# Patient Record
Sex: Male | Born: 1950 | Race: White | Hispanic: No | Marital: Married | Smoking: Former smoker
Health system: Southern US, Community
[De-identification: ages and names within clinical notes are randomized; demographics above are authoritative.]

## PROBLEM LIST (undated history)

## (undated) DIAGNOSIS — M109 Gout, unspecified: Secondary | ICD-10-CM

## (undated) DIAGNOSIS — E119 Type 2 diabetes mellitus without complications: Secondary | ICD-10-CM

## (undated) DIAGNOSIS — M5137 Other intervertebral disc degeneration, lumbosacral region: Secondary | ICD-10-CM

## (undated) DIAGNOSIS — M19011 Primary osteoarthritis, right shoulder: Secondary | ICD-10-CM

## (undated) DIAGNOSIS — E785 Hyperlipidemia, unspecified: Secondary | ICD-10-CM

## (undated) HISTORY — DX: Primary osteoarthritis, right shoulder: M19.011

## (undated) HISTORY — DX: Gout, unspecified: M10.9

## (undated) HISTORY — DX: Type 2 diabetes mellitus without complications: E11.9

## (undated) HISTORY — DX: Hyperlipidemia, unspecified: E78.5

## (undated) HISTORY — DX: Other intervertebral disc degeneration, lumbosacral region: M51.37

## (undated) HISTORY — PX: ROTATOR CUFF REPAIR: SHX139

## (undated) HISTORY — PX: LUMBAR LAMINECTOMY: SHX95

---

## 2000-07-13 ENCOUNTER — Ambulatory Visit (HOSPITAL_COMMUNITY): Admission: RE | Admit: 2000-07-13 | Discharge: 2000-07-13 | Payer: Self-pay | Admitting: Urology

## 2008-01-01 ENCOUNTER — Ambulatory Visit (HOSPITAL_COMMUNITY): Admission: RE | Admit: 2008-01-01 | Discharge: 2008-01-02 | Payer: Self-pay | Admitting: Specialist

## 2009-05-06 ENCOUNTER — Ambulatory Visit (HOSPITAL_COMMUNITY): Admission: RE | Admit: 2009-05-06 | Discharge: 2009-05-06 | Payer: Self-pay | Admitting: Specialist

## 2010-05-24 ENCOUNTER — Other Ambulatory Visit: Payer: Self-pay | Admitting: Internal Medicine

## 2010-05-24 ENCOUNTER — Encounter: Payer: Self-pay | Admitting: Internal Medicine

## 2010-05-24 ENCOUNTER — Ambulatory Visit
Admission: RE | Admit: 2010-05-24 | Discharge: 2010-05-24 | Payer: Self-pay | Source: Home / Self Care | Attending: Internal Medicine | Admitting: Internal Medicine

## 2010-05-24 DIAGNOSIS — E119 Type 2 diabetes mellitus without complications: Secondary | ICD-10-CM | POA: Insufficient documentation

## 2010-05-24 DIAGNOSIS — M19019 Primary osteoarthritis, unspecified shoulder: Secondary | ICD-10-CM | POA: Insufficient documentation

## 2010-05-24 DIAGNOSIS — M109 Gout, unspecified: Secondary | ICD-10-CM | POA: Insufficient documentation

## 2010-05-24 DIAGNOSIS — I10 Essential (primary) hypertension: Secondary | ICD-10-CM | POA: Insufficient documentation

## 2010-05-24 DIAGNOSIS — M545 Low back pain, unspecified: Secondary | ICD-10-CM | POA: Insufficient documentation

## 2010-05-24 DIAGNOSIS — E785 Hyperlipidemia, unspecified: Secondary | ICD-10-CM | POA: Insufficient documentation

## 2010-05-24 LAB — LIPID PANEL
Cholesterol: 100 mg/dL (ref 0–200)
HDL: 25.3 mg/dL — ABNORMAL LOW (ref >39.00)
LDL Cholesterol: 58 mg/dL (ref 0–99)
Total CHOL/HDL Ratio: 4
VLDL: 17.2 mg/dL (ref 0.0–40.0)

## 2010-05-24 LAB — BASIC METABOLIC PANEL
BUN: 22 mg/dL (ref 6–23)
Calcium: 9.4 mg/dL (ref 8.4–10.5)
Creatinine, Ser: 1.1 mg/dL (ref 0.4–1.5)
GFR: 72.17 mL/min (ref >60.00)
Glucose, Bld: 101 mg/dL — ABNORMAL HIGH (ref 70–99)
Potassium: 4.7 mEq/L (ref 3.5–5.1)
Sodium: 141 mEq/L (ref 135–145)

## 2010-05-24 LAB — HEPATIC FUNCTION PANEL
ALT: 29 U/L (ref 0–53)
Total Protein: 6.9 g/dL (ref 6.0–8.3)

## 2010-06-02 NOTE — Assessment & Plan Note (Signed)
Summary: NEW [employer] PT--PKG--#---STC   Vital Signs:  Patient profile:   60 year old male Height:      72 inches Weight:      224 pounds BMI:     30.49 O2 Sat:      97 % on Room air Temp:     98.0 degrees F oral Pulse rate:   70 / minute BP sitting:   128 / 78  (left arm) Cuff size:   large  Vitals Entered By: Ami Bullins CMA (May 24, 2010 1:40 PM)  O2 Flow:  Room air CC: cpx/ ab  Vision Screening:      Vision Comments: 07/2009 normal exam   Primary Care Provider:  [name] Jeffery Valdez  CC:  cpx/ ab.  History of Present Illness: Patient presents to re-establish for on-going continuity care. He has no problems at this time.   In the recent past he had a cardiac work-up with a nuclear stress test  that was read out as normal [facility] Valdez cardiology - Mady Haagensen). He had been on metroprolol but due to heart rate in the 40's and low BP medication was stopped and he has been doing well on Benicar.   His diabetes has been well controlled with metformin. He has had recent treatment for URI with z-pak. This has driven his CBGs into the low 100s from the usual 90s. His last A1C was in July '11 and was close to 6%. Generally he is feeling well.  He had back surgery in '09 - lumbar foraminotomy. January '11 - repair of right rotator cuff (Dr.  Jillyn Hidden)  He is otherwise doing well. Had a flex many years ago with polyps. He has never had recommended follow-up.  Preventive Screening-Counseling & Management  Alcohol-Tobacco     Alcohol drinks/day: 0     Smoking Status: quit     Year Quit: 1974  Caffeine-Diet-Exercise     Caffeine use/day: none     Does Patient Exercise: yes     Type of exercise: walking     Times/week: 3  Hep-HIV-STD-Contraception     Dental Visit-last 6 months yes     Sun Exposure-Excessive: no  Safety-Violence-Falls     Seat Belt Use: yes     Firearms in the Home: firearms in the home     Smoke Detectors: yes     Violence in the Home: no risk noted     Sexual  Abuse: no     Fall Risk: low fall risk      Blood Transfusions:  no.    Current Medications (verified): 1)  Ginseng 250 Mg Caps (Ginseng) .Marland Kitchen.. 1 Daily 2)  Fish Oil 1000 Mg Caps (Omega-3 Fatty Acids) .Marland Kitchen.. 1 Capsule Daily 3)  Metformin Hcl 500 Mg Tabs (Metformin Hcl) .Marland Kitchen.. 1 Tablet Two Times A Day 4)  Benicar Hct 40-25 Mg Tabs (Olmesartan Medoxomil-Hctz) .Marland Kitchen.. 1 Tablet Once Daily 5)  Simvastatin 40 Mg Tabs (Simvastatin) .... 1/2 Tablet At Bedtime 6)  Allopurinol 300 Mg Tabs (Allopurinol) .Marland Kitchen.. 1 Tablet Once Daily 7)  Azithromycin 250 Mg Tabs (Azithromycin)  Allergies (verified): No Known Drug Allergies  Past History:  Past Medical History: HYPERLIPIDEMIA (ICD-272.4) DEGENERATIVE JOINT DISEASE, RIGHT SHOULDER (ICD-715.91) DISC DISEASE, LUMBOSACRAL SPINE (ICD-722.52) GOUT, UNSPECIFIED (ICD-274.9) ESSENTIAL HYPERTENSION (ICD-401.9) DIABETES MELLITUS, TYPE II, CONTROLLED (ICD-250.00)   Physician Roster:               ortho - Dr. 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         last eye exam April '11 - no retinopathy (Dr. Gavin Potters - Mady Haagensen)  Physical Exam  General:  Overweight white male in no distress Head:  Normocephalic and atraumatic without obvious abnormalities. No apparent alopecia or balding. Eyes:  No corneal or conjunctival inflammation noted. EOMI. Perrla. Funduscopic exam benign, without hemorrhages, exudates or papilledema. Vision grossly normal. Ears:  External ear exam shows no significant lesions or deformities.  Otoscopic examination reveals clear canals, tympanic membranes are intact bilaterally without bulging, retraction, inflammation or discharge. Hearing is grossly normal bilaterally. Nose:  no external deformity and no external erythema.   Mouth:  Oral mucosa and oropharynx without lesions or exudates.  Teeth in good repair. Neck:  supple, full ROM, no thyromegaly, and no carotid bruits.   Chest Wall:  No deformities, masses, tenderness or gynecomastia noted. Lungs:  Normal respiratory effort, chest expands symmetrically. Lungs are clear to auscultation, no crackles or wheezes. Heart:  Normal rate and regular rhythm. S1 and S2 normal without gallop, murmur, click, rub or other extra sounds. Abdomen:  obese, soft, non-tender, normal bowel sounds, no guarding, no rigidity, no rebound tenderness, and no hepatomegaly.   Prostate:  deferred after full discussion of options. Msk:  normal ROM, no joint tenderness, no joint swelling, no joint warmth, and no joint instability.   Pulses:  2+ radial and DP pulses Extremities:  No clubbing, cyanosis, edema, or deformity noted with normal full range of motion of all joints.   Neurologic:  alert & oriented X3, cranial nerves II-XII intact, strength normal in all extremities, gait normal, and DTRs symmetrical and normal.   Skin:  turgor normal, no suspicious lesions, and no ulcerations.   Cervical Nodes:  no anterior cervical adenopathy and no posterior cervical  adenopathy.   Psych:  Oriented X3, memory intact for recent and remote, normally interactive, good eye contact, and not anxious appearing.    Diabetes Management Exam:    Foot Exam (with socks and/or shoes not present):       Sensory-Pinprick/Light touch:          Right medial foot (L-4): normal          Right dorsal foot (L-5): normal          Right lateral foot (S-1): normal       Sensory-Monofilament:          Right foot: normal       Sensory-other: normal deep vibratory sensation.       Inspection:          Right foot: normal  Nails:          Right foot: normal    Eye Exam:       Eye Exam done elsewhere          Date: 02/09/2010          Results: normal          Done by: Mady Haagensen Opthalmologist   Impression & Recommendations:  Problem # 1:  HYPERLIPIDEMIA (ICD-272.4) Due for lab with recommendations to follow.  His updated medication list for this problem includes:    Simvastatin 40 Mg Tabs (Simvastatin) .Marland Kitchen... 1/2 tablet at bedtime  Orders: TLB-Lipid Panel (80061-LIPID) TLB-Hepatic/Liver Function Pnl (80076-HEPATIC)  Addendum - LDL excellent at 58. HDL is low at 25.3  Plan - continue simvastatine at 40mg            consider use of fishoil to raise HDL  Problem # 2:  DEGENERATIVE JOINT DISEASE, RIGHT SHOULDER (ICD-715.91) Well preserved function with no limitations in his desired activities.  Plan - range of motiion exercise          APAP or NSAIDs as needed.          surgicalconsult if pain worsen or there is any limitation i ADLs  Problem # 3:  GOUT, UNSPECIFIED (ICD-274.9) No recent flares. He does take allopurinol routinely.  Plan - uric acid level with recommendations to follow.  His updated medication list for this problem includes:    Allopurinol 300 Mg Tabs (Allopurinol) .Marland Kitchen... 1 tablet once daily  Orders: TLB-Uric Acid, Blood (84550-URIC)  Addendum - Uric level is in normal range. Plan is to continue allopurinol.  Problem # 4:  ESSENTIAL  HYPERTENSION (ICD-401.9)  His updated medication list for this problem includes:    Benicar Hct 40-25 Mg Tabs (Olmesartan medoxomil-hctz) .Marland Kitchen... 1 tablet once daily  Orders: TLB-BMP (Basic Metabolic Panel-BMET) (80048-METABOL)  BP today: 128/78  Addendeum - great control and renal function and electrolytes are normal.  Problem # 5:  DIABETES MELLITUS, TYPE II, CONTROLLED (ICD-250.00) Dur for follow-up lab with recommendations to follow.  His updated medication list for this problem includes:    Metformin Hcl 500 Mg Tabs (Metformin hcl) .Marland Kitchen... 1 tablet two times a day    Benicar Hct 40-25 Mg Tabs (Olmesartan medoxomil-hctz) .Marland Kitchen... 1 tablet once daily  Orders: TLB-A1C / Hgb A1C (Glycohemoglobin) (83036-A1C)  Addendum - A1C 7% = at goal.  Plan - continue present dose of metformin.  Problem # 6:  Preventive Health Care (ICD-V70.0) Interval history is normal. Physical exam except for weight is normal. Lab results are excellent. Pt is due for screening colonoscopy and follow-up after flex six several years ago with polyp -referred to GI. Prostate cancer screening with PSA is normal. Immunizations - up to date with tetnus. Recommend consideeration of pneumonia vaccine and shingles vaccine.  In - summary : a very nice many who remains active with his calling. He is medically stable. He will continue on his present regimen and return as needed .   Complete Medication List: 1)  Ginseng 250 Mg Caps (Ginseng) .Marland Kitchen.. 1 daily 2)  Fish Oil 1000 Mg Caps (Omega-3 fatty acids) .Marland Kitchen.. 1 capsule daily 3)  Metformin Hcl 500 Mg Tabs (Metformin hcl) .Marland Kitchen.. 1 tablet two times a day 4)  Benicar Hct 40-25 Mg Tabs (Olmesartan medoxomil-hctz) .Marland Kitchen.. 1 tablet once daily 5)  Simvastatin 40 Mg Tabs (Simvastatin) .... 1/2 tablet at bedtime 6)  Allopurinol 300 Mg Tabs (Allopurinol) .Marland Kitchen.. 1 tablet once daily 7)  Azithromycin 250 Mg Tabs (Azithromycin)  Other Orders: TLB-PSA (Prostate Specific Antigen)  (84153-PSA)   Patient: Jeffery Valdez Note: All result statuses are Final unless otherwise noted.  Tests: (1) Hemoglobin A1C (A1C)   Hemoglobin A1C       [H]  7.0 %                       4.6-6.5     Glycemic Control Guidelines for People with Diabetes:     Non Diabetic:  <6%     Goal of Therapy: <7%     Additional Action Suggested:  >8%   Tests: (2) BMP (METABOL)   Sodium                    141 mEq/L                   135-145   Potassium                 4.7 mEq/L                   3.5-5.1   Chloride                  105 mEq/L                   96-112   Carbon Dioxide            30 mEq/L                    19-32   Glucose              [H]  101 mg/dL                   16-10   BUN                       22 mg/dL                    9-60   Creatinine                1.1 mg/dL                   4.5-4.0   Calcium                   9.4 mg/dL                   9.8-11.9   GFR                       72.17 mL/min                >60.00  Tests: (3) Uric Acid (URIC)   Uric Acid                 4.6 mg/dL                   1.4-7.8  Tests: (4) Lipid Panel (LIPID)   Cholesterol               100 mg/dL                   2-956     ATP III Classification            Desirable:  < 200 mg/dL  Borderline High:  200 - 239 mg/dL               High:  > = 240 mg/dL   Triglycerides             86.0 mg/dL                  7.8-295.6     Normal:  <150 mg/dL     Borderline High:  213 - 199 mg/dL   HDL                  [L]  08.65 mg/dL                 >78.46   VLDL Cholesterol          17.2 mg/dL                  9.6-29.5   LDL Cholesterol           58 mg/dL                    2-84  CHO/HDL Ratio:  CHD Risk                             4                    Men          Women     1/2 Average Risk     3.4          3.3     Average Risk          5.0          4.4     2X Average Risk          9.6          7.1     3X Average Risk          15.0          11.0                           Tests: (5)  Hepatic/Liver Function Panel (HEPATIC)   Total Bilirubin           0.5 mg/dL                   1.3-2.4   Direct Bilirubin          0.1 mg/dL                   4.0-1.0   Alkaline Phosphatase [L]  38 U/L                      39-117   AST                       22 U/L                      0-37   ALT                       29 U/L                      0-53   Total Protein  6.9 g/dL                    9.1-4.7   Albumin                   4.1 g/dL                    8.2-9.5  Tests: (6) Prostate Specific Antigen (PSA)   PSA-Hyb                   1.01 ng/mL                  0.10-4.00  Orders Added: 1)  Est. Patient 40-64 years [99396] 2)  New Patient Level II [99202] 3)  TLB-A1C / Hgb A1C (Glycohemoglobin) [83036-A1C] 4)  TLB-BMP (Basic Metabolic Panel-BMET) [80048-METABOL] 5)  TLB-Uric Acid, Blood [84550-URIC] 6)  TLB-Lipid Panel [80061-LIPID] 7)  TLB-Hepatic/Liver Function Pnl [80076-HEPATIC] 8)  TLB-PSA (Prostate Specific Antigen) [62130-QMV]   Immunization History:  Influenza Immunization History:    Influenza:  historical (03/01/2010)   Immunization History:  Influenza Immunization History:    Influenza:  Historical (03/01/2010)

## 2010-06-03 ENCOUNTER — Telehealth: Payer: Self-pay | Admitting: Internal Medicine

## 2010-06-08 NOTE — Progress Notes (Signed)
  Phone Note Refill Request Message from:  Fax from Pharmacy on June 03, 2010 8:27 AM  Refills Requested: Medication #1:  BENICAR HCT 40-25 MG TABS 1 tablet once daily Initial call taken by: Ami Bullins CMA,  June 03, 2010 8:27 AM    Prescriptions: BENICAR HCT 40-25 MG TABS (OLMESARTAN MEDOXOMIL-HCTZ) 1 tablet once daily  #90 x 3   Entered by:   Ami Bullins CMA   Authorized by:   Jacques Navy MD   Signed by:   Bill Salinas CMA on 06/03/2010   Method used:   Faxed to ...       [facility] (mail-order)       [address]       [place], [place]  [address]       Ph: [phone]       Fax: [phone]   RxID:   [id number]

## 2010-06-09 ENCOUNTER — Telehealth: Payer: Self-pay | Admitting: Internal Medicine

## 2010-06-16 NOTE — Progress Notes (Signed)
  Phone Note Call from Patient Call back at [phone]   Caller: Spouse Summary of Call: Patient is requesting a 10 day supply of Benicar to be sent to [facility] untill his mail order arrives. Initial call taken by: Rock Nephew CMA,  June 09, 2010 8:49 AM    Prescriptions: BENICAR HCT 40-25 MG TABS (OLMESARTAN MEDOXOMIL-HCTZ) 1 tablet once daily  #11 x 0   Entered by:   Ami Bullins CMA   Authorized by:   Jacques Navy MD   Signed by:   Bill Salinas CMA on 06/09/2010   Method used:   Electronically to        [facility]. [phone]* (retail)       [address]       [place], [place]  [address]       Ph: [phone] or [phone]       Fax: [phone]   RxID:   [id number]

## 2010-06-17 ENCOUNTER — Telehealth: Payer: Self-pay | Admitting: Internal Medicine

## 2010-06-22 NOTE — Progress Notes (Signed)
  Phone Note Call from Patient Call back at Home Phone [phone]   Caller: [phone] Summary of Call: Req a call today regarding benicar.  Initial call taken by: Lamar Sprinkles, CMA,  June 17, 2010 4:40 PM  Follow-up for Phone Call        Rx's resent - wife aware Follow-up by: Lamar Sprinkles, CMA,  June 17, 2010 5:39 PM    Prescriptions: BENICAR HCT 40-25 MG TABS (OLMESARTAN MEDOXOMIL-HCTZ) 1 tablet once daily  #14 x 0   Entered by:   Lamar Sprinkles, CMA   Authorized by:   Jacques Navy MD   Signed by:   Lamar Sprinkles, CMA on 06/17/2010   Method used:   Electronically to        [facility]. [phone]* (retail)       [address]       [place], [place]  [address]       Ph: [phone] or [phone]       Fax: [phone]   RxID:   [id number] BENICAR HCT 40-25 MG TABS (OLMESARTAN MEDOXOMIL-HCTZ) 1 tablet once daily  #90 x 3   Entered by:   Lamar Sprinkles, CMA   Authorized by:   Jacques Navy MD   Signed by:   Lamar Sprinkles, CMA on 06/17/2010   Method used:   Faxed to ...       [facility] (mail-order)       [address] [place], [place]  [address]       Ph: [phone]       Fax: [phone]   RxID:   [id number]

## 2010-07-16 LAB — COMPREHENSIVE METABOLIC PANEL
Alkaline Phosphatase: 34 U/L — ABNORMAL LOW (ref 39–117)
BUN: 20 mg/dL (ref 6–23)
Calcium: 9.4 mg/dL (ref 8.4–10.5)
Creatinine, Ser: 0.98 mg/dL (ref 0.4–1.5)
GFR calc Af Amer: >60 mL/min (ref >60)
GFR calc non Af Amer: >60 mL/min (ref >60)
Glucose, Bld: 131 mg/dL — ABNORMAL HIGH (ref 70–99)
Potassium: 4.5 mEq/L (ref 3.5–5.1)
Sodium: 139 mEq/L (ref 135–145)

## 2010-07-16 LAB — CBC
HCT: 45.3 % (ref 39.0–52.0)
Hemoglobin: 15.3 g/dL (ref 13.0–17.0)
MCHC: 33.8 g/dL (ref 30.0–36.0)
MCV: 94.9 fL (ref 78.0–100.0)
RBC: 4.77 MIL/uL (ref 4.22–5.81)

## 2010-07-16 LAB — GLUCOSE, CAPILLARY

## 2010-07-21 ENCOUNTER — Other Ambulatory Visit: Payer: Self-pay | Admitting: Dermatology

## 2010-08-01 ENCOUNTER — Encounter: Payer: Self-pay | Admitting: Internal Medicine

## 2010-08-01 ENCOUNTER — Ambulatory Visit (INDEPENDENT_AMBULATORY_CARE_PROVIDER_SITE_OTHER): Payer: BC Managed Care – PPO | Admitting: Internal Medicine

## 2010-08-01 DIAGNOSIS — E119 Type 2 diabetes mellitus without complications: Secondary | ICD-10-CM

## 2010-08-01 DIAGNOSIS — E785 Hyperlipidemia, unspecified: Secondary | ICD-10-CM

## 2010-08-01 MED ORDER — GLUCOSE BLOOD VI STRP: 1.0000 | ORAL_STRIP | Freq: Every day | Status: DC | PRN

## 2010-08-01 NOTE — Progress Notes (Signed)
  Subjective:    Patient ID: Jeffery Valdez, male    DOB: [birth year], 60 y.o.   MRN: [id number]  HPI Jeffery Valdez was seen as a new patient in Jan '12. Labs at that time revealed an A1C 7%, LDL 56. He was taking metformin 500mg  bid and simvastatin 20mg  daily. IN the interval he had started to take red yeast rice and developed a better diet. Several days ago he developed profound weakness with focal symptoms. He was too weak to stand in the pulpit. On his own he stopped taking metformin and simvastatin and in a day or so felt much better. He has been on metformin since '07.    Review of Systems Review of Systems  Constitutional:  Negative for fever, chills, activity change and unexpected weight change.  HENT:  Negative for hearing loss, ear pain, congestion, neck stiffness and postnasal drip.   Eyes: Negative for pain, discharge and visual disturbance.  Respiratory: Negative for chest tightness and wheezing.   Cardiovascular: Negative for chest pain and palpitations.       [No decreased exercise tolerance Gastrointestinal: [No change in bowel habit. No bloating or gas. No reflux or indigestion Genitourinary: Negative for urgency, frequency, flank pain and difficulty urinating.  Musculoskeletal: Negative for myalgias, back pain, arthralgias and gait problem.  Neurological: Negative for dizziness, tremors, weakness and headaches.  Hematological: Negative for adenopathy.  Psychiatric/Behavioral: Negative for behavioral problems and dysphoric mood.       Objective:   Physical Exam  [vitalsreviewed. Constitutional: He is oriented to person, place, and time. He appears well-nourished. No distress.  HENT:  Head: Normocephalic and atraumatic.  Eyes: Conjunctivae and EOM are normal.  Cardiovascular: Normal rate and regular rhythm.   Pulmonary/Chest: Effort normal and breath sounds normal.  Musculoskeletal: Normal range of motion.  Neurological: He is alert and oriented to person, place, and  time. He has normal reflexes.  Skin: Skin is warm and dry.          Assessment & Plan:  1. Weakness - suspect the problem was doubling up on statin dosing with red yeast added to simvastatin.  Plan - continue red yeast rice and no simvastatin.           Lipid profile in 4 weeks with adjustment as needed to stay at goal of LDL =100 or less           Resume metformin 500mg  bid with A1C at next lab draw.

## 2010-08-29 ENCOUNTER — Other Ambulatory Visit (INDEPENDENT_AMBULATORY_CARE_PROVIDER_SITE_OTHER): Payer: BC Managed Care – PPO

## 2010-08-29 DIAGNOSIS — E785 Hyperlipidemia, unspecified: Secondary | ICD-10-CM

## 2010-08-29 DIAGNOSIS — E119 Type 2 diabetes mellitus without complications: Secondary | ICD-10-CM

## 2010-08-29 LAB — HEMOGLOBIN A1C: Hgb A1c MFr Bld: 6.9 % — ABNORMAL HIGH (ref 4.6–6.5)

## 2010-08-29 LAB — LIPID PANEL
LDL Cholesterol: 92 mg/dL (ref 0–99)
Total CHOL/HDL Ratio: 5
Triglycerides: 146 mg/dL (ref 0.0–149.0)

## 2010-09-05 ENCOUNTER — Encounter: Payer: Self-pay | Admitting: Internal Medicine

## 2010-09-13 NOTE — Op Note (Signed)
NAMEAZARIAS, CHIOU NO.:  [id number]   MEDICAL RECORD NO.:  [id number]          PATIENT TYPE:  AMB   LOCATION:  DAY                          FACILITY:  [facility]   PHYSICIAN:  Jene Every, M.D.    DATE OF BIRTH:  Jul 03, 1950   DATE OF PROCEDURE:  01/01/2008  DATE OF DISCHARGE:                               OPERATIVE REPORT   PREOPERATIVE DIAGNOSES:  Spinal stenosis and herniated nucleus pulposus  at L3-4.   POSTOPERATIVE DIAGNOSES:  Spinal stenosis and herniated nucleus pulposus  at L3-4.   PROCEDURE PERFORMED:  Central decompression at L3-4, with central  laminectomy and L4 foraminotomies, microdiskectomy at L3-4.   ANESTHESIA:  General.   ASSISTANT:  Roma Schanz, P.A.   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  Indicated for decompression of the L4 root by microdiskectomy and  central decompression, with possible decompression of L4-5.   The risks and benefits were discussed, including bleeding, infection,  damage to neurovascular structures, CSF leakage, epidural fibrosis,  adjacent segment disease with the need for fusion in the future,  anesthetic complications, etc.   TECHNIQUE:  With the patient in the supine position and after adequate  level anesthesia and 2 grams Kefzol.  He was then placed prone on the  [place] frame.  All bony prominences were well-padded.  The lumbar  region was prepped and draped in the usual sterile fashion.  A Tuohy 18-  gauge spinal needle  was utilized to localize the L4-5 interspace;  confirmed with x-ray.  The incision was made from the spinous process of  L3 to L4.  The subcutaneous tissue was dissected.  Electrocautery was  used to achieve hemostasis.  The dorsolumbar fascia identified and  divided in line with the skin incision.  The paraspinous  muscle was  elevated from the lamina at L3 and L4.  McCullough retractor was placed.  Covers were placed in the interlaminar space, confirmed by x-ray.  Operating microscope was draped and brought into the surgical field.  The patient had a fairly narrow interspinous process at the interlaminar  window; we therefore chose to enter centrally.  I used the [employer] to perform partial removal of the spinous process of L3 and L4,  with hemilaminotomies at the caudad edge of L3.  We used a 2-mm Kerrison  to detach the ligamentum flavum from the cephalad edge of L3, left  greater than right.  Hypertrophic ligamentum flavum was noted.  The  lateral recess decompressed the medial border of the pedicle, with  neural paddings placed beneath the knee at the neural elements.  Ligamentum flavum was removed bilaterally, as it was particularly  hypertrophic on the left and combined with an osteophytic spur and disk  herniation; and compressing the L4 root into the lateral recess.  There  was an epidural venous plexus, which required mobilization.  I extended  the foraminotomy of L4 to identify the L4 root, and up to the L3 root.  A large focal prominent mass was noted laterally; it was confirmed by x-  ray to be the disk space.  There were vascular lesion in the posterior  longitudinal ligament, the edge of which was gently mobilized medially -  - exposing the osteophyte and disk complex.  I checked the neural  elements at all times, using an 18-gauge needle to enter the disk  herniation.  It was felt to be consistent with disk.   Then an annulotomy was performed and I removed disk material with a  micropituitary; further mobilized with a nerve hook.  The predominance  of the disk mass was hard and osteophytic spurring.  We had to again  decompress the lateral recess to the medial border of the pedicle.  Once  this diskectomy was performed, the decompression was performed.  We had  good  excursion of the L4 root medial to the pedicle.  The disk space was  copiously irrigated with antibiotic irrigation, there was no CSF leakage  or active bleeding.  I passed a hockey stick probe up to the pedicle of  L3 and below out the foramen of L4 and caudad.  There was also on the  right side no evidence of neural compressive lesion noted in the L3-4  foramen.  We had decompressed the ligamentum of flavum on the  contralateral side as well.   Again, the disk space was copiously irrigated with antibiotic  irrigation.  Bone wax was placed on the cancellous surfaces.  Thrombin-  soaked Gelfoam was placed in the laminotomy defect.  The McCullough  retractors were removed.  The paraspinous muscles were irrigated and  inspected; and no evidence of active bleeding.  The dorsolumbar fascia  reapproximated with #1 Vicryl in interrupted figure-of-eight sutures.  The subcutaneous tissue was reapproximated with 2-0 Vicryl simple  sutures.  Skin was reapproximated with staples and the wound was dressed  sterilely.  Je was placed supine on the hospital bed, extubated without  difficulty and transported to the recovery room in satisfactory  condition.  The patient tolerated the procedure well with no  complications.      Jene Every, M.D.  Electronically Signed     JB/MEDQ  D:  01/01/2008  T:  01/01/2008  Job:  [id number]

## 2010-09-16 NOTE — Op Note (Signed)
[facility]  Patient:    Jeffery Valdez, Jeffery Valdez                 MRN: [id number] Adm. Date:  [id number] Attending:  Ellwood Handler CC:         Rosalyn Gess. Norins, M.D. [facility]   Operative Report  REFERRING PHYSICIAN:  Rosalyn Gess. Norins, M.D. [facility]  UROLOGIST:  Verl Dicker, M.D.  PREOPERATIVE DIAGNOSIS:  Phimosis.  POSTOPERATIVE DIAGNOSIS:  Phimosis.  PROCEDURE:  Circumcision.  ANESTHESIA:  General with a penile block.  SPECIMENS:  Fibrotic preputial skin.  DESCRIPTION OF PROCEDURE:  The patient was prepped and draped in the dorsolithotomy position after institution of an adequate level of general anesthesia.  A circumferential penile block using 0.25% Marcaine without epinephrine was instituted.  A circumferential incision was then made proximal to the subcoronal sulcus.  A similar incision was made proximal to the original incision, and a ring of erythematous, edematous, fibrotic preputial skin was removed in a "parallelized technique."  Bleeding sites were lightly cauterized using needle tip Bovie.  The subcu was reapproximated with interrupted sutures of 4-0 Dexon.  The skin was reapproximated with stitches of 4-0 chromic.  The wound was covered with Bacitracin ointment, dry gauze, and Coban tape.  The patient was returned to recovery in satisfactory condition. DD:  07/12/00 TD:  07/13/00 Job: [id number]

## 2010-09-19 ENCOUNTER — Telehealth: Payer: Self-pay | Admitting: *Deleted

## 2010-09-19 MED ORDER — METFORMIN HCL 500 MG PO TABS: 500.0000 mg | ORAL_TABLET | Freq: Two times a day (BID) | ORAL | Status: DC

## 2010-09-19 NOTE — Telephone Encounter (Signed)
Done,  Patient informed

## 2010-09-19 NOTE — Telephone Encounter (Signed)
Patient requesting RX RF of metformin, unsure pharm

## 2010-10-21 ENCOUNTER — Encounter: Payer: Self-pay | Admitting: Internal Medicine

## 2010-10-24 ENCOUNTER — Other Ambulatory Visit (INDEPENDENT_AMBULATORY_CARE_PROVIDER_SITE_OTHER): Payer: BC Managed Care – PPO

## 2010-10-24 ENCOUNTER — Encounter: Payer: Self-pay | Admitting: Internal Medicine

## 2010-10-24 ENCOUNTER — Ambulatory Visit (INDEPENDENT_AMBULATORY_CARE_PROVIDER_SITE_OTHER): Payer: BC Managed Care – PPO | Admitting: Internal Medicine

## 2010-10-24 DIAGNOSIS — R5381 Other malaise: Secondary | ICD-10-CM

## 2010-10-24 DIAGNOSIS — E119 Type 2 diabetes mellitus without complications: Secondary | ICD-10-CM

## 2010-10-24 DIAGNOSIS — E785 Hyperlipidemia, unspecified: Secondary | ICD-10-CM

## 2010-10-24 DIAGNOSIS — R5383 Other fatigue: Secondary | ICD-10-CM

## 2010-10-24 LAB — LIPID PANEL
Cholesterol: 160 mg/dL (ref 0–200)
HDL: 44.3 mg/dL (ref >39.00)
LDL Cholesterol: 103 mg/dL — ABNORMAL HIGH (ref 0–99)
Total CHOL/HDL Ratio: 4
Triglycerides: 65 mg/dL (ref 0.0–149.0)

## 2010-10-24 NOTE — Progress Notes (Signed)
  Subjective:    Patient ID: Jeffery Valdez, male    DOB: 26-Mar-1951, 60 y.o.   MRN: [id number]  HPI Jeffery Valdez presents for evaluation of persistent post-prandial fatigue usually 1-2 hrs after lunch. This fatigue will last several hours. It has been worse when he misses lunch. He has no associated symptoms: abdominal pain, sweats, fevers, headache, neurologic symptoms. He does take metformin but has had no problems at any other time of day or after other meals. He has stop zocor in favor of red yeast rice but this hasn't made a difference.   He had a bad bout of bronchitis 4 weeks ago. Treated at Urgent Care and was treated with z-pak x 2 rounds. He has no had any cough. He still has a lot mucus production.   PMH, FamHx and SocHx reviewed for any changes and relevance.    Review of Systems Review of Systems  Constitutional:  Negative for fever, chills, activity change and unexpected weight change.  HEENT:  Negative for hearing loss, ear pain, congestion, neck stiffness and postnasal drip. Negative for sore throat or swallowing problems. Negative for dental complaints.   Eyes: Negative for vision loss or change in visual acuity.  Respiratory: Negative for chest tightness and wheezing.   Cardiovascular: Negative for chest pain and palpitation. No decreased exercise tolerance Gastrointestinal: No change in bowel habit. No bloating or gas. No reflux or indigestion Genitourinary: Negative for urgency, frequency, flank pain and difficulty urinating.  Musculoskeletal: Negative for myalgias, back pain, arthralgias and gait problem.  Neurological: Negative for dizziness, tremors, weakness and headaches.  Hematological: Negative for adenopathy.  Psychiatric/Behavioral: Negative for behavioral problems and dysphoric mood.       Objective:   Physical Exam Vitals noted. Gen'l- WNWD white male in no distress Neck - supply, no thyromegaly. Pul - normal respirations, lungs clear Cor - RRR        Assessment & Plan:  Post-prandial somnolence -  Plan  patient is asked to check CBG during these periods of fatigue to rule out post-prandial hypoglycemia          Will check lab: thyroid function, A1C, B12 levels

## 2010-10-24 NOTE — Patient Instructions (Signed)
Post-prandial fatigue - check blood sugar in the midst of the fatigue to make sure your not dropping your blood sugar. If you are.....call. We will also check thyroid function  Diabetes- will check routine A1C  Cholesterol - will check lipid panel to insure that red yeast rice has you at goal of an LDL 100 or less.   Allergic rhinitis - the probable cause of all the mucus drainage. First step - take a non-sedating antihistamine, e.g. Loratadine 10mg  once a day. OK to continue the mucinex which is a mucolytic (thins secretions.)

## 2010-10-25 NOTE — Assessment & Plan Note (Signed)
Due for routine lab follow-up with recommendations to follow

## 2010-10-25 NOTE — Assessment & Plan Note (Signed)
Due for routine lab follow-up - A1C

## 2010-10-27 ENCOUNTER — Encounter: Payer: Self-pay | Admitting: Internal Medicine

## 2011-01-30 ENCOUNTER — Other Ambulatory Visit: Payer: Self-pay | Admitting: Internal Medicine

## 2011-02-01 LAB — URINALYSIS, ROUTINE W REFLEX MICROSCOPIC
Bilirubin Urine: NEGATIVE
Glucose, UA: NEGATIVE
Hgb urine dipstick: NEGATIVE
Protein, ur: NEGATIVE
Urobilinogen, UA: 0.2

## 2011-02-01 LAB — GLUCOSE, CAPILLARY
Glucose-Capillary: 107 — ABNORMAL HIGH
Glucose-Capillary: 138 — ABNORMAL HIGH

## 2011-03-19 ENCOUNTER — Other Ambulatory Visit: Payer: Self-pay | Admitting: Internal Medicine

## 2011-03-22 ENCOUNTER — Ambulatory Visit (INDEPENDENT_AMBULATORY_CARE_PROVIDER_SITE_OTHER): Payer: BC Managed Care – PPO | Admitting: *Deleted

## 2011-03-22 DIAGNOSIS — Z23 Encounter for immunization: Secondary | ICD-10-CM

## 2011-03-28 ENCOUNTER — Telehealth: Payer: Self-pay | Admitting: *Deleted

## 2011-03-28 MED ORDER — GLUCOSE BLOOD VI STRP: ORAL_STRIP | Status: DC

## 2011-03-28 NOTE — Telephone Encounter (Signed)
May have any of these meters. If we have them to give out - ok along with sending in Rx for strips

## 2011-03-28 NOTE — Telephone Encounter (Signed)
Have ultra mini & ultra 2. Patient informed. Strips Rx sent to pharmacy.

## 2011-03-28 NOTE — Telephone Encounter (Signed)
Patient's wife states letter received from [employer] stating that patient must use one of the following Glucose Monitor: Accu-Chek Aviva; Accu-Chek Compact Plus; OneTouch Ultra II; OneTouch Ultra Smart; OneTouch Ultra Mini. Request your advisement on a preference between these choices for patient and subsequently request a new Rx for monitor to pharmacy.

## 2011-03-29 ENCOUNTER — Other Ambulatory Visit: Payer: Self-pay | Admitting: *Deleted

## 2011-03-29 MED ORDER — GLUCOSE BLOOD VI STRP: ORAL_STRIP | Status: AC

## 2011-05-10 ENCOUNTER — Telehealth: Payer: Self-pay | Admitting: *Deleted

## 2011-05-10 MED ORDER — OLMESARTAN MEDOXOMIL-HCTZ 40-25 MG PO TABS: 1.0000 | ORAL_TABLET | Freq: Every day | ORAL | Status: DC

## 2011-05-10 NOTE — Telephone Encounter (Signed)
Refill request for Benicar HCT 40/25mg .

## 2011-06-30 ENCOUNTER — Other Ambulatory Visit: Payer: Self-pay | Admitting: *Deleted

## 2011-06-30 MED ORDER — METFORMIN HCL 500 MG PO TABS: 500.0000 mg | ORAL_TABLET | Freq: Two times a day (BID) | ORAL | Status: DC

## 2011-07-11 ENCOUNTER — Other Ambulatory Visit: Payer: Self-pay | Admitting: Dermatology

## 2011-07-31 ENCOUNTER — Telehealth: Payer: Self-pay | Admitting: Internal Medicine

## 2011-07-31 NOTE — Telephone Encounter (Signed)
Pt given samples of Benicar & new OneTouch Mini meter/SLS

## 2011-07-31 NOTE — Telephone Encounter (Signed)
Requesting samples of Benicare and states Glucose Meter is running high, states it is the machine not his level

## 2011-08-31 ENCOUNTER — Ambulatory Visit (INDEPENDENT_AMBULATORY_CARE_PROVIDER_SITE_OTHER): Payer: PRIVATE HEALTH INSURANCE | Admitting: Internal Medicine

## 2011-08-31 ENCOUNTER — Encounter: Payer: Self-pay | Admitting: Internal Medicine

## 2011-08-31 ENCOUNTER — Other Ambulatory Visit (INDEPENDENT_AMBULATORY_CARE_PROVIDER_SITE_OTHER): Payer: PRIVATE HEALTH INSURANCE

## 2011-08-31 VITALS — BP 140/82 | HR 82 | Temp 98.4°F | Resp 16 | Wt 212.0 lb

## 2011-08-31 DIAGNOSIS — E785 Hyperlipidemia, unspecified: Secondary | ICD-10-CM

## 2011-08-31 DIAGNOSIS — Z1211 Encounter for screening for malignant neoplasm of colon: Secondary | ICD-10-CM

## 2011-08-31 DIAGNOSIS — Z0000 Encounter for general adult medical examination without abnormal findings: Secondary | ICD-10-CM

## 2011-08-31 DIAGNOSIS — I10 Essential (primary) hypertension: Secondary | ICD-10-CM

## 2011-08-31 DIAGNOSIS — E119 Type 2 diabetes mellitus without complications: Secondary | ICD-10-CM

## 2011-08-31 DIAGNOSIS — M109 Gout, unspecified: Secondary | ICD-10-CM

## 2011-08-31 LAB — COMPREHENSIVE METABOLIC PANEL
Albumin: 4.6 g/dL (ref 3.5–5.2)
Alkaline Phosphatase: 40 U/L (ref 39–117)
BUN: 20 mg/dL (ref 6–23)
Creatinine, Ser: 1.1 mg/dL (ref 0.4–1.5)
Glucose, Bld: 96 mg/dL (ref 70–99)
Potassium: 4.1 mEq/L (ref 3.5–5.1)
Total Bilirubin: 0.6 mg/dL (ref 0.3–1.2)

## 2011-08-31 LAB — LIPID PANEL
Cholesterol: 160 mg/dL (ref 0–200)
HDL: 42.1 mg/dL (ref >39.00)
Triglycerides: 67 mg/dL (ref 0.0–149.0)
VLDL: 13.4 mg/dL (ref 0.0–40.0)

## 2011-08-31 LAB — HEPATIC FUNCTION PANEL
ALT: 30 U/L (ref 0–53)
Alkaline Phosphatase: 40 U/L (ref 39–117)
Bilirubin, Direct: 0.1 mg/dL (ref 0.0–0.3)
Total Bilirubin: 0.6 mg/dL (ref 0.3–1.2)

## 2011-08-31 LAB — URIC ACID: Uric Acid, Serum: 4.9 mg/dL (ref 4.0–7.8)

## 2011-08-31 MED ORDER — OLMESARTAN MEDOXOMIL-HCTZ 40-25 MG PO TABS: 1.0000 | ORAL_TABLET | Freq: Every day | ORAL | Status: DC

## 2011-08-31 MED ORDER — METFORMIN HCL 500 MG PO TABS: 500.0000 mg | ORAL_TABLET | Freq: Two times a day (BID) | ORAL | Status: DC

## 2011-08-31 MED ORDER — ALLOPURINOL 300 MG PO TABS: 300.0000 mg | ORAL_TABLET | Freq: Every day | ORAL | Status: AC

## 2011-08-31 NOTE — Progress Notes (Signed)
Subjective:    Patient ID: Jeffery Valdez, male    DOB: May 04, 1950, 61 y.o.   MRN: [id number]  HPI The patient is here for annual  examination and management of other chronic and acute problems.   The risk factors are reflected in the social history.  The roster of all physicians providing medical care to patient - is listed in the Snapshot section of the chart.  Activities of daily living:  The patient is 100% inedpendent in all ADLs: dressing, toileting, feeding as well as independent mobility  There is no risks for hepatitis, STDs or HIV. There is no   history of blood transfusion. They have no travel history to infectious disease endemic areas of the world.  The patient has  seen their dentist in the last six month. They have seen their eye doctor in the last year. They deny any hearing difficulty and have not had audiologic testing in the last year.  They do not  have excessive sun exposure. Discussed the need for sun protection: hats, long sleeves and use of sunscreen if there is significant sun exposure.   Diet: the importance of a healthy diet is discussed. They do try to follow a good diabetic diet.  The patient has no regular exercise program but hopes to get back to walking soon.  The benefits of regular aerobic exercise were discussed.  Depression screen: there are no signs or vegative symptoms of depression- irritability, change in appetite, anhedonia, sadness/tearfullness.  Cognitive assessment: the patient manages all their financial and personal affairs and is actively engaged.  The following portions of the patient's history were reviewed and updated as appropriate: allergies, current medications, past family history, past medical history,  past surgical history, past social history  and problem list.  Vision, hearing, body mass index were assessed and reviewed.   During the course of the visit the patient was educated and counseled about appropriate screening and  preventive services including : fall prevention , diabetes screening, nutrition counseling, colorectal cancer screening, and recommended immunizations.  Past Medical History  Diagnosis Date  . Hyperlipidemia   . Degenerative joint disease of right shoulder   . Degeneration of lumbar or lumbosacral intervertebral disc   . Gout, unspecified   . Essential hypertension   . Diabetes mellitus type 2, controlled    Past Surgical History  Procedure Date  . Lumbar laminectomy     '09  . Rotator cuff repair     Right '11  . Circumcision, non-newborn    Family History  Problem Relation Age of Onset  . Diabetes Mother   . Hypertension Mother   . Peripheral vascular disease Mother   . Other Mother     peripheral neuropathy, Abdominal mass  . Cancer Mother     gastric cancer with metastatic disease  . Hypertension Father   . Pneumonia Father   . Dementia Father   . Other Father     CVA  . Heart disease Father   . Stroke Father   . Coronary artery disease Neg Hx   . Hyperlipidemia Sister   . Hypertension Sister   . Fibromyalgia Brother    History   Social History  . Marital Status: Married    Spouse Name: N/A    Number of Children: 2  . Years of Education: 16   Occupational History  . 2    Social History Main Topics  . Smoking status: Former Smoker    Quit date: 05/01/1972  .  Smokeless tobacco: Never Used  . Alcohol Use: No  . Drug Use: No  . Sexually Active: Yes   Other Topics Concern  . Not on file   Social History Narrative   [employer] [place], [place]. Married '71. 2 daughters '74, '76; five grandchildrenLives with wife, mother in the home. Work- Immunologist    Current Outpatient Prescriptions on File Prior to Visit  Medication Sig Dispense Refill  . allopurinol (ZYLOPRIM) 300 MG tablet Take 300 mg by mouth daily.        Marland Kitchen aspirin 500 MG EC tablet Take 500 mg by mouth 2 (two) times daily.        Marland Kitchen glucose blood test strip Use as  instructed to check blood glucose levels.OneTouch Ultra Test Strips.  100 each  12  . metFORMIN (GLUCOPHAGE) 500 MG tablet Take 1 tablet (500 mg total) by mouth 2 (two) times daily with a meal.  180 tablet  0  . olmesartan-hydrochlorothiazide (BENICAR HCT) 40-25 MG per tablet Take 1 tablet by mouth daily.  30 tablet  2  . Omega-3 Fatty Acids (FISH OIL) 1000 MG CAPS Take by mouth.        . Red Yeast Rice 600 MG CAPS Take by mouth daily.        Satira Sark Johns Wort 300 MG CAPS Take by mouth every other day.           Review of Systems Constitutional:  Negative for fever, chills, activity change and unexpected weight change.  HEENT:  Negative for hearing loss, ear pain, congestion, neck stiffness and postnasal drip. Negative for sore throat or swallowing problems. Negative for dental complaints.   Eyes: Negative for vision loss or change in visual acuity.  Respiratory: Negative for chest tightness and wheezing. Negative for DOE.   Cardiovascular: Negative for chest pain or palpitations. No decreased exercise tolerance Gastrointestinal: No change in bowel habit. No bloating or gas. No reflux or indigestion Genitourinary: Negative for urgency, frequency, flank pain and difficulty urinating.  Musculoskeletal: Negative for myalgias, back pain, arthralgias and gait problem.  Neurological: Negative for dizziness, tremors, weakness and headaches.  Hematological: Negative for adenopathy.  Psychiatric/Behavioral: Negative for behavioral problems and dysphoric mood.       Objective:   Physical Exam Filed Vitals:   08/31/11 0946  BP: 140/82  Pulse: 82  Temp: 98.4 F (36.9 C)  Resp: 16   Wt Readings from Last 3 Encounters:  08/31/11 212 lb (96.163 kg)  10/24/10 216 lb 4 oz (98.09 kg)  08/01/10 221 lb (100.245 kg)    Gen'l: Well nourished well developed, overweight white male in no acute distress  HEENT: Head: Normocephalic and atraumatic. Right Ear: External ear normal. EAC-cerumen impaction.  Left Ear: External ear normal.  EAC w/ cerumen impaction. Nose: Nose normal. Mouth/Throat: Oropharynx is clear and moist. Dentition - native, in good repair. No buccal or palatal lesions. Posterior pharynx clear. Eyes: Conjunctivae and sclera clear. EOM intact. Pupils are equal, round, and reactive to light. Right eye exhibits no discharge. Left eye exhibits no discharge. Neck: Normal range of motion. Neck supple. No JVD present. No tracheal deviation present. No thyromegaly present.  Cardiovascular: Normal rate, regular rhythm, no gallop, no friction rub, no murmur heard.      Quiet precordium. 2+ radial and DP pulses . No carotid bruits Pulmonary/Chest: Effort normal. No respiratory distress or increased WOB, no wheezes, no rales. No chest wall deformity or CVAT. Abdominal: Soft. Bowel sounds  are normal in all quadrants. He exhibits no distension, no tenderness, no rebound or guarding, he does demonstrate diastasis recti.  No heptosplenomegaly  Genitourinary:  deferred Musculoskeletal: Normal range of motion. He exhibits no edema and no tenderness.       Small and large joints without redness, synovial thickening or deformity. Full range of motion preserved about all small, median and large joints.  Lymphadenopathy:    He has no cervical or supraclavicular adenopathy.  Neurological: He is alert and oriented to person, place, and time. CN II-XII intact. DTRs 2+ and symmetrical biceps, radial and patellar tendons. Cerebellar function normal with no tremor, rigidity, normal gait and station. right foot - normal sensation to light touch, pin-prick. Mild decrease in deep vibratory sensation. Skin: Skin is warm and dry. No rash noted. No erythema.  Psychiatric: He has a normal mood and affect. His behavior is normal. Thought content normal.   Lab Results  Component Value Date                       GLUCOSE 96 08/31/2011   CHOL 160 08/31/2011   TRIG 67.0 08/31/2011   HDL 42.10 08/31/2011   LDLCALC 105*  08/31/2011        ALT 30 08/31/2011   AST 21 08/31/2011        NA 139 08/31/2011   K 4.1 08/31/2011   CL 100 08/31/2011   CREATININE 1.1 08/31/2011   BUN 20 08/31/2011   CO2 29 08/31/2011   TSH 3.00 10/24/2010   PSA 1.01 05/24/2010   HGBA1C 6.7* 08/31/2011         Assessment & Plan:

## 2011-09-03 DIAGNOSIS — Z0000 Encounter for general adult medical examination without abnormal findings: Secondary | ICD-10-CM | POA: Insufficient documentation

## 2011-09-03 NOTE — Assessment & Plan Note (Signed)
BP Readings from Last 3 Encounters:  08/31/11 140/82  10/24/10 128/74  08/01/10 124/82   Very good control previous years, up a little today.  Plan Continue present medication  Increased exercise as possible and close attention to weight

## 2011-09-03 NOTE — Assessment & Plan Note (Signed)
Good control with A1c better than goal of 7% or less  Plan Continue metformin, diet and exercise.

## 2011-09-03 NOTE — Assessment & Plan Note (Signed)
Interval medical history is benign. Physical exam is normal. Due for colorectal cancer screening - referral in process. Immunizations: tetanus Jan '13; due for shingles and pneumonia vaccine. Prostate cancer screening - PSA 1.01 in Jan '12.  In summary - a nice man who has his chronic medical problems under good control. He will work on increasing his exercise. He is referred to GI for colonoscopy.

## 2011-09-03 NOTE — Assessment & Plan Note (Signed)
Good control with LDL cholesterol very close to goal of 100 or less.  Plan  Continue life-style management without need to restart medication.

## 2011-09-03 NOTE — Assessment & Plan Note (Signed)
No recent flares. Uric acid level normal at 4.9

## 2011-09-11 ENCOUNTER — Other Ambulatory Visit: Payer: Self-pay | Admitting: Internal Medicine

## 2011-09-11 NOTE — Telephone Encounter (Signed)
Pt requesting generic benicar---[facility]  [place]---pt ph# [phone]

## 2011-09-11 NOTE — Telephone Encounter (Signed)
Do not believe there is a generic for benicar. Can try different product, e.g. Losartan 100 mg daily, #30. Will need to monitor BP closely

## 2011-09-12 ENCOUNTER — Other Ambulatory Visit: Payer: Self-pay | Admitting: *Deleted

## 2011-09-12 MED ORDER — LOSARTAN POTASSIUM 100 MG PO TABS: 100.0000 mg | ORAL_TABLET | Freq: Every day | ORAL | Status: DC

## 2011-09-12 NOTE — Telephone Encounter (Signed)
Notified patient  Of Rx . Patient will monitor his BP at home when he checks his blood sugars and will keep a log of it. If any problems will call back or follow up with Dr,

## 2011-10-06 ENCOUNTER — Other Ambulatory Visit: Payer: Self-pay | Admitting: *Deleted

## 2011-10-06 MED ORDER — METFORMIN HCL 500 MG PO TABS: 500.0000 mg | ORAL_TABLET | Freq: Two times a day (BID) | ORAL | Status: DC

## 2011-10-06 NOTE — Telephone Encounter (Signed)
REFILL SENT TO [facility] FOR METFORMIN

## 2011-11-01 ENCOUNTER — Ambulatory Visit (INDEPENDENT_AMBULATORY_CARE_PROVIDER_SITE_OTHER): Payer: PRIVATE HEALTH INSURANCE | Admitting: Internal Medicine

## 2011-11-01 ENCOUNTER — Encounter: Payer: Self-pay | Admitting: Internal Medicine

## 2011-11-01 VITALS — BP 180/100 | HR 68 | Temp 98.5°F | Resp 16 | Wt 214.0 lb

## 2011-11-01 DIAGNOSIS — I10 Essential (primary) hypertension: Secondary | ICD-10-CM

## 2011-11-01 MED ORDER — FUROSEMIDE 40 MG PO TABS: 40.0000 mg | ORAL_TABLET | Freq: Every day | ORAL | Status: DC

## 2011-11-01 NOTE — Patient Instructions (Addendum)
Elevated Blood pressure - the problem is that when the medication was switched from Benicar/Hct 40/25 there was no continuation of a diuretic.  Plan - continue the losartan 100 mg once a day  Add furosemide 40 mg daily  Be sure you eat bananas, citrus, leafy greens for the potassium  Call me Monday, July 8th with BP readings.

## 2011-11-01 NOTE — Progress Notes (Signed)
  Subjective:    Patient ID: Jeffery Valdez, male    DOB: 03/03/1951, 61 y.o.   MRN: [id number]  HPI Jeffery Valdez presents acutely for out of control BP with rading of 210/110! He was recently switched from Benicar/Hct 40/25 to cozaar 100mg . He has had some swelling but he attributed this to too much salt and with better diet the swelling has subsided. He has had no headach, nosebleed, change in vision.  PMH, FamHx and SocHx reviewed for any changes and relevance.    Review of Systems System review is negative for any constitutional, cardiac, pulmonary, GI or neuro symptoms or complaints other than as described in the HPI.     Objective:   Physical Exam Filed Vitals:   11/01/11 1008  BP: 180/100  Pulse: 68  Temp: 98.5 F (36.9 C)  Resp: 16   HEENT- fundi w/o hemorrhage Cor- RRR Pulm - normal respirations.       Assessment & Plan:

## 2011-11-03 NOTE — Assessment & Plan Note (Signed)
Very poor control. Was switched from Benicar/hct to losartan w/o diuretic  Plan - continue losartan  Add furosemide 40 mg q D  Call back with BP readings July 8th

## 2011-11-06 ENCOUNTER — Telehealth: Payer: Self-pay | Admitting: *Deleted

## 2011-11-06 NOTE — Telephone Encounter (Signed)
Moving in the right direction. Continue present medications. Report back Thursday.

## 2011-11-06 NOTE — Telephone Encounter (Signed)
Patient called to document blood pressure this AM. 157/93.

## 2011-11-06 NOTE — Telephone Encounter (Signed)
Patient notified

## 2011-11-09 ENCOUNTER — Telehealth: Payer: Self-pay

## 2011-11-09 NOTE — Telephone Encounter (Signed)
Pt advised of same via M

## 2011-11-09 NOTE — Telephone Encounter (Signed)
Pt called to report BP readings per MD's request - 136/84 this am

## 2011-11-09 NOTE — Telephone Encounter (Signed)
Definitely better. Continue present medication schedule.

## 2012-04-08 LAB — HM DIABETES EYE EXAM: HM Diabetic Eye Exam: NORMAL

## 2012-04-19 ENCOUNTER — Encounter: Payer: Self-pay | Admitting: Internal Medicine

## 2013-12-29 ENCOUNTER — Encounter: Payer: Self-pay | Admitting: Internal Medicine

## 2014-04-15 ENCOUNTER — Encounter: Payer: PRIVATE HEALTH INSURANCE | Admitting: Internal Medicine

## 2014-05-05 ENCOUNTER — Other Ambulatory Visit (INDEPENDENT_AMBULATORY_CARE_PROVIDER_SITE_OTHER): Payer: PPO

## 2014-05-05 ENCOUNTER — Encounter: Payer: Self-pay | Admitting: Internal Medicine

## 2014-05-05 ENCOUNTER — Ambulatory Visit (INDEPENDENT_AMBULATORY_CARE_PROVIDER_SITE_OTHER): Payer: PPO | Admitting: Internal Medicine

## 2014-05-05 VITALS — BP 130/74 | HR 72 | Temp 98.2°F | Resp 14 | Ht 72.0 in | Wt 201.6 lb

## 2014-05-05 DIAGNOSIS — I10 Essential (primary) hypertension: Secondary | ICD-10-CM

## 2014-05-05 DIAGNOSIS — M545 Low back pain, unspecified: Secondary | ICD-10-CM

## 2014-05-05 DIAGNOSIS — Z0000 Encounter for general adult medical examination without abnormal findings: Secondary | ICD-10-CM

## 2014-05-05 DIAGNOSIS — M722 Plantar fascial fibromatosis: Secondary | ICD-10-CM

## 2014-05-05 DIAGNOSIS — E119 Type 2 diabetes mellitus without complications: Secondary | ICD-10-CM

## 2014-05-05 LAB — LIPID PANEL
Cholesterol: 136 mg/dL (ref 0–200)
HDL: 30 mg/dL — ABNORMAL LOW (ref >39.00)
LDL Cholesterol: 84 mg/dL (ref 0–99)
NonHDL: 106
TRIGLYCERIDES: 110 mg/dL (ref 0.0–149.0)
Total CHOL/HDL Ratio: 5
VLDL: 22 mg/dL (ref 0.0–40.0)

## 2014-05-05 LAB — BASIC METABOLIC PANEL
BUN: 22 mg/dL (ref 6–23)
CALCIUM: 9.2 mg/dL (ref 8.4–10.5)
CO2: 27 meq/L (ref 19–32)
Chloride: 106 mEq/L (ref 96–112)
Creatinine, Ser: 1 mg/dL (ref 0.4–1.5)
GFR: 83.39 mL/min (ref >60.00)
GLUCOSE: 107 mg/dL — AB (ref 70–99)
POTASSIUM: 4 meq/L (ref 3.5–5.1)
SODIUM: 141 meq/L (ref 135–145)

## 2014-05-05 LAB — HEMOGLOBIN A1C: Hgb A1c MFr Bld: 6.6 % — ABNORMAL HIGH (ref 4.6–6.5)

## 2014-05-05 NOTE — Assessment & Plan Note (Signed)
Patient got colonoscopy at [facility], he is unclear if he had pneumonia at [place]. Declines shingles vaccine. Up to date on flu shot.

## 2014-05-05 NOTE — Assessment & Plan Note (Signed)
Placed referral for physical therapy and he will continue to use OTC pain relief. No red flag signs to suggest need for imaging.

## 2014-05-05 NOTE — Progress Notes (Signed)
Pre visit review using our clinic review tool, if applicable. No additional management support is needed unless otherwise documented below in the visit note. 

## 2014-05-05 NOTE — Assessment & Plan Note (Signed)
Check HgA1c today, continue metformin. He is on ARB, check lipid panel and BMP.

## 2014-05-05 NOTE — Patient Instructions (Signed)
We will have you work with those exercises for your foot, try to do the exercise for about 30 seconds times 3, twice a day to help the plantar fasciitis.   We will wait on the hernia and if it is bothering you more or starts to be more painful please let us know.  We will check your blood work today and send you for physical therapy for your back.  We will see you back in about 6 months for a check up of your diabetes.   If you have any problems or questions please feel free to call us sooner.   Plantar Fasciitis (Heel Spur Syndrome) with Rehab The plantar fascia is a fibrous, ligament-like, soft-tissue structure that spans the bottom of the foot. Plantar fasciitis is a condition that causes pain in the foot due to inflammation of the tissue. SYMPTOMS   Pain and tenderness on the underneath side of the foot.  Pain that worsens with standing or walking. CAUSES  Plantar fasciitis is caused by irritation and injury to the plantar fascia on the underneath side of the foot. Common mechanisms of injury include:  Direct trauma to bottom of the foot.  Damage to a small nerve that runs under the foot where the main fascia attaches to the heel bone.  Stress placed on the plantar fascia due to bone spurs. RISK INCREASES WITH:   Activities that place stress on the plantar fascia (running, jumping, pivoting, or cutting).  Poor strength and flexibility.  Improperly fitted shoes.  Tight calf muscles.  Flat feet.  Failure to warm-up properly before activity.  Obesity. PREVENTION  Warm up and stretch properly before activity.  Allow for adequate recovery between workouts.  Maintain physical fitness:  Strength, flexibility, and endurance.  Cardiovascular fitness.  Maintain a health body weight.  Avoid stress on the plantar fascia.  Wear properly fitted shoes, including arch supports for individuals who have flat feet. PROGNOSIS  If treated properly, then the symptoms of  plantar fasciitis usually resolve without surgery. However, occasionally surgery is necessary. RELATED COMPLICATIONS   Recurrent symptoms that may result in a chronic condition.  Problems of the lower back that are caused by compensating for the injury, such as limping.  Pain or weakness of the foot during push-off following surgery.  Chronic inflammation, scarring, and partial or complete fascia tear, occurring more often from repeated injections. TREATMENT  Treatment initially involves the use of ice and medication to help reduce pain and inflammation. The use of strengthening and stretching exercises may help reduce pain with activity, especially stretches of the Achilles tendon. These exercises may be performed at home or with a therapist. Your caregiver may recommend that you use heel cups of arch supports to help reduce stress on the plantar fascia. Occasionally, corticosteroid injections are given to reduce inflammation. If symptoms persist for greater than 6 months despite non-surgical (conservative), then surgery may be recommended.  MEDICATION   If pain medication is necessary, then nonsteroidal anti-inflammatory medications, such as aspirin and ibuprofen, or other minor pain relievers, such as acetaminophen, are often recommended.  Do not take pain medication within 7 days before surgery.  Prescription pain relievers may be given if deemed necessary by your caregiver. Use only as directed and only as much as you need.  Corticosteroid injections may be given by your caregiver. These injections should be reserved for the most serious cases, because they may only be given a certain number of times. HEAT AND COLD  Cold treatment (  icing) relieves pain and reduces inflammation. Cold treatment should be applied for 10 to 15 minutes every 2 to 3 hours for inflammation and pain and immediately after any activity that aggravates your symptoms. Use ice packs or massage the area with a piece of  ice (ice massage).  Heat treatment may be used prior to performing the stretching and strengthening activities prescribed by your caregiver, physical therapist, or athletic trainer. Use a heat pack or soak the injury in warm water. SEEK IMMEDIATE MEDICAL CARE IF:  Treatment seems to offer no benefit, or the condition worsens.  Any medications produce adverse side effects. EXERCISES RANGE OF MOTION (ROM) AND STRETCHING EXERCISES - Plantar Fasciitis (Heel Spur Syndrome) These exercises may help you when beginning to rehabilitate your injury. Your symptoms may resolve with or without further involvement from your physician, physical therapist or athletic trainer. While completing these exercises, remember:   Restoring tissue flexibility helps normal motion to return to the joints. This allows healthier, less painful movement and activity.  An effective stretch should be held for at least 30 seconds.  A stretch should never be painful. You should only feel a gentle lengthening or release in the stretched tissue. RANGE OF MOTION - Toe Extension, Flexion  Sit with your right / left leg crossed over your opposite knee.  Grasp your toes and gently pull them back toward the top of your foot. You should feel a stretch on the bottom of your toes and/or foot.  Hold this stretch for __________ seconds.  Now, gently pull your toes toward the bottom of your foot. You should feel a stretch on the top of your toes and or foot.  Hold this stretch for __________ seconds. Repeat __________ times. Complete this stretch __________ times per day.  RANGE OF MOTION - Ankle Dorsiflexion, Active Assisted  Remove shoes and sit on a chair that is preferably not on a carpeted surface.  Place right / left foot under knee. Extend your opposite leg for support.  Keeping your heel down, slide your right / left foot back toward the chair until you feel a stretch at your ankle or calf. If you do not feel a stretch,  slide your bottom forward to the edge of the chair, while still keeping your heel down.  Hold this stretch for __________ seconds. Repeat __________ times. Complete this stretch __________ times per day.  STRETCH - Gastroc, Standing  Place hands on wall.  Extend right / left leg, keeping the front knee somewhat bent.  Slightly point your toes inward on your back foot.  Keeping your right / left heel on the floor and your knee straight, shift your weight toward the wall, not allowing your back to arch.  You should feel a gentle stretch in the right / left calf. Hold this position for __________ seconds. Repeat __________ times. Complete this stretch __________ times per day. STRETCH - Soleus, Standing  Place hands on wall.  Extend right / left leg, keeping the other knee somewhat bent.  Slightly point your toes inward on your back foot.  Keep your right / left heel on the floor, bend your back knee, and slightly shift your weight over the back leg so that you feel a gentle stretch deep in your back calf.  Hold this position for __________ seconds. Repeat __________ times. Complete this stretch __________ times per day. STRETCH - Gastrocsoleus, Standing  Note: This exercise can place a lot of stress on your foot and ankle. Please complete  this exercise only if specifically instructed by your caregiver.   Place the ball of your right / left foot on a step, keeping your other foot firmly on the same step.  Hold on to the wall or a rail for balance.  Slowly lift your other foot, allowing your body weight to press your heel down over the edge of the step.  You should feel a stretch in your right / left calf.  Hold this position for __________ seconds.  Repeat this exercise with a slight bend in your right / left knee. Repeat __________ times. Complete this stretch __________ times per day.  STRENGTHENING EXERCISES - Plantar Fasciitis (Heel Spur Syndrome)  These exercises may  help you when beginning to rehabilitate your injury. They may resolve your symptoms with or without further involvement from your physician, physical therapist or athletic trainer. While completing these exercises, remember:   Muscles can gain both the endurance and the strength needed for everyday activities through controlled exercises.  Complete these exercises as instructed by your physician, physical therapist or athletic trainer. Progress the resistance and repetitions only as guided. STRENGTH - Towel Curls  Sit in a chair positioned on a non-carpeted surface.  Place your foot on a towel, keeping your heel on the floor.  Pull the towel toward your heel by only curling your toes. Keep your heel on the floor.  If instructed by your physician, physical therapist or athletic trainer, add ____________________ at the end of the towel. Repeat __________ times. Complete this exercise __________ times per day. STRENGTH - Ankle Inversion  Secure one end of a rubber exercise band/tubing to a fixed object (table, pole). Loop the other end around your foot just before your toes.  Place your fists between your knees. This will focus your strengthening at your ankle.  Slowly, pull your big toe up and in, making sure the band/tubing is positioned to resist the entire motion.  Hold this position for __________ seconds.  Have your muscles resist the band/tubing as it slowly pulls your foot back to the starting position. Repeat __________ times. Complete this exercises __________ times per day.  Document Released: 04/17/2005 Document Revised: 07/10/2011 Document Reviewed: 07/30/2008 [facility] Patient Information 2015 [place], [place]. This information is not intended to replace advice given to you by your health care provider. Make sure you discuss any questions you have with your health care provider.

## 2014-05-05 NOTE — Progress Notes (Signed)
   Subjective:    Patient ID: Jeffery Valdez, male    DOB: [birth year], 64 y.o.   MRN: [id number]  HPI The patient is a 64 YO man who comes in today to establish care. He has PMH of DM type 2 (well controlled on metformin), recent ulcer in December (gets care at [facility]), he has an umbilical hernia which is stable. He also has been struggling with plantar fasciitis for some time and got some corrective shoes from the [place] but these have not helped much. He has not taken one of his blood pressure medicines this morning. He denies any other complaints. He does take gabapentin for some burning in his feet which he is not sure if comes from previous back surgery or his diabetes.   Review of Systems  Constitutional: Negative for fever, activity change, appetite change, fatigue and unexpected weight change.  HENT: Negative.   Respiratory: Negative for cough, chest tightness, shortness of breath and wheezing.   Cardiovascular: Negative for chest pain, palpitations and leg swelling.  Gastrointestinal: Negative for abdominal pain, diarrhea, constipation, blood in stool and abdominal distention.  Musculoskeletal: Positive for back pain and arthralgias. Negative for myalgias and gait problem.  Skin: Negative.   Neurological: Negative for dizziness, weakness, light-headedness and headaches.  Psychiatric/Behavioral: Negative.        Objective:   Physical Exam  Constitutional: He is oriented to person, place, and time. He appears well-developed and well-nourished.  HENT:  Head: Normocephalic and atraumatic.  Eyes: EOM are normal.  Neck: Normal range of motion.  Cardiovascular: Normal rate and regular rhythm.   Pulmonary/Chest: Effort normal and breath sounds normal. No respiratory distress. He has no wheezes. He has no rales.  Abdominal: Soft. Bowel sounds are normal. He exhibits no distension. There is no tenderness.  Umbilical hernia, not incarcerated.   Musculoskeletal: He exhibits no edema.    Neurological: He is alert and oriented to person, place, and time. No cranial nerve deficit.   Filed Vitals:   05/05/14 0825 05/05/14 0857  BP: 170/78 130/74  Pulse: 72   Temp: 98.2 F (36.8 C)   TempSrc: Oral   Resp: 14   Height: 6' (1.829 m)   Weight: 201 lb 9.6 oz (91.445 kg)   SpO2: 98%       Assessment & Plan:

## 2014-05-05 NOTE — Assessment & Plan Note (Signed)
BP well controlled, did not take his bp medicine this morning due to visit. Initial elevated and recheck better. Check BMP today.

## 2014-05-08 ENCOUNTER — Ambulatory Visit: Payer: PPO

## 2014-05-08 ENCOUNTER — Telehealth: Payer: Self-pay | Admitting: *Deleted

## 2014-05-08 ENCOUNTER — Ambulatory Visit: Payer: PPO | Attending: Internal Medicine | Admitting: Physical Therapy

## 2014-05-08 DIAGNOSIS — M545 Low back pain, unspecified: Secondary | ICD-10-CM

## 2014-05-08 NOTE — Patient Instructions (Signed)
Double Knee to Chest (Flexion)   Gently pull both knees toward chest. Feel stretch in lower back or buttock area. Breathing deeply, Hold ___30_ seconds. Repeat __3__ times. Do _2___ sessions per day.  http://gt2.exer.us/227   Copyright  VHI. All rights reserved.  Piriformis (Supine)   Cross legs, right on top. Gently pull other knee toward chest until stretch is felt in buttock/hip of top leg. Hold _30___ seconds. Repeat ___3_ times per set.  Do __2__ sessions per day.  http://orth.exer.us/676   Copyright  VHI. All rights reserved.  Lower Trunk Rotation Stretch   Keeping back flat and feet together, rotate knees to left side. Hold ___30_ seconds. Repeat __3_ times per set. . Do __2__ sessions per day.  http://orth.exer.us/122   Copyright  VHI. All rights reserved.    Hamstring Step 2   Left foot relaxed, knee straight, other leg bent, foot flat. Raise straight leg further upward to maximal range. Hold _30__ seconds. Relax leg completely down. Repeat ___ times.  Copyright  VHI. All rights reserved.   Laureen Abrahams, PT, DPT 05/08/2014 8:56 AM  [facility] [address], [place] [address]  [phone] (office) [phone] (fax)

## 2014-05-08 NOTE — Therapy (Signed)
[place], [place], [address] Phone: [phone]   Fax:  [phone]  Physical Therapy Evaluation  Patient Details  Name: Jeffery Valdez MRN: [id number] Date of Birth: Dec 16, 1950 Referring Provider:  Olga Millers, MD  Encounter Date: 05/08/2014      PT End of Session - 05/08/14 0912    Visit Number 1   Number of Visits 12   Date for PT Re-Evaluation 07/07/14   PT Start Time 0825   PT Stop Time 0907   PT Time Calculation (min) 42 min   Activity Tolerance Patient tolerated treatment well   Behavior During Therapy [facility] for tasks assessed/performed      Past Medical History  Diagnosis Date  . Hyperlipidemia   . Degenerative joint disease of right shoulder   . Degeneration of lumbar or lumbosacral intervertebral disc   . Gout, unspecified   . Essential hypertension   . Diabetes mellitus type 2, controlled     Past Surgical History  Procedure Laterality Date  . Lumbar laminectomy      '09  . Rotator cuff repair      Right '11  . Circumcision, non-newborn      There were no vitals taken for this visit.  Visit Diagnosis:  Midline low back pain without sciatica - Plan: PT plan of care cert/re-cert      Subjective Assessment - 05/08/14 0831    Symptoms Pt is a 64 y/o male who presents to OPPT for low back pain since age 9.  Pt reports back surgery in 2009.  Pt reports pain with standing upright and needs to reposition frequently with sitting.   Limitations Standing;Walking;Sitting   How long can you sit comfortably? 30 min   How long can you stand comfortably? 45 min   How long can you walk comfortably? 0.5 miles due to plantar fasciitis   Diagnostic tests no recent imaging   Patient Stated Goals decrease pain in back, improve lumbar strength, return to walking   Currently in Pain? Yes   Pain Score 5    Pain Location Back   Pain Orientation Mid;Lower   Pain Descriptors / Indicators  Dull;Aching   Pain Type Chronic pain   Pain Onset More than a month ago   Pain Frequency Constant   Aggravating Factors  sitting, standing upright for long periods   Pain Relieving Factors repositioning          [facility] PT Assessment - 05/08/14 0837    Assessment   Medical Diagnosis low back pain   Onset Date --  since age of 41   Next MD Visit PRN   Prior Therapy PT 2009 following back surgery   Precautions   Precautions None   Restrictions   Weight Bearing Restrictions No   Balance Screen   Has the patient fallen in the past 6 months No   Has the patient had a decrease in activity level because of a fear of falling?  No   Is the patient reluctant to leave their home because of a fear of falling?  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; sitting during office hours   Leisure walking, gym 2 days/week, fish, play golf   Cognition   Overall Cognitive Status Within Functional Limits  for tasks assessed   Observation/Other Assessments   Observations R SLR positive, L SLR negative; bil hamstring tightness, bil piriformis tightness; tender to palpation along lumbar paraspinals    Focus on Therapeutic Outcomes (FOTO)  64 (36% limited; predicted 36% limited)   Posture/Postural Control   Posture/Postural Control Postural limitations   Postural Limitations Decreased lumbar lordosis;Increased thoracic kyphosis   Posture Comments in standing pt with lean to left at thoracic spine   AROM   Overall AROM Comments tightness/pulling with flexion   Lumbar Flexion 56   Lumbar Extension 12   Lumbar - Right Side Bend 24   Lumbar - Left Side Bend 15   Strength   Right Hip Flexion 3+/5   Right Hip Extension 3/5   Right Hip ABduction 3/5   Left Hip Flexion 4/5   Left  Hip Extension 4/5   Left Hip ABduction 4/5   Right Knee Flexion 5/5   Right Knee Extension 5/5   Left Knee Flexion 5/5   Left Knee Extension 5/5   Right Ankle Dorsiflexion 5/5   Left Ankle Dorsiflexion 5/5                          PT Education - 03-Jun-2014 0912    Education provided Yes   Education Details HEP, goals of care, plan of care, clinical findings   Person(s) Educated Patient   Methods Explanation;Demonstration;Handout   Comprehension Verbalized understanding;Returned demonstration;Need further instruction             PT Long Term Goals - 06/03/2014 0917    PT LONG TERM GOAL #1   Title independent with HEP (06/19/14)   Time 6   Period Weeks   Status New   PT LONG TERM GOAL #2   Title perform lumbar flexion without increase in pain for improved mobility (06/19/14)   Time 6   Period Weeks   Status New   PT LONG TERM GOAL #3   Title report ability to sit > 45 min without increase in pain (06/19/14)   Time 6   Period Weeks   Status New   PT LONG TERM GOAL #4   Title 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  Pt presents today with hip, core and lumbar weakness as well as lower extremity tightness.  Will benefit from PT to maximize function and decrease pain.   Pt will benefit from skilled therapeutic intervention in order to improve on the following deficits Pain;Impaired flexibility;Decreased range of motion;Postural dysfunction;Improper body mechanics;Decreased strength   Rehab Potential Good   PT Frequency 2x / week   PT Duration 6 weeks   PT Treatment/Interventions ADLs/Self Care Home Management;Cryotherapy;Electrical Stimulation;Functional mobility training;Neuromuscular re-education;Ultrasound;Manual  techniques;Passive range of motion;Therapeutic exercise;Traction;Contrast Bath;Therapeutic activities;Patient/family education   PT Next Visit Plan review HEP, add core and hip strengthening exercises   Consulted and Agree with Plan of Care Patient          G-Codes - 06/03/14 0920    Functional Assessment Tool Used FOTO 36% limited   Functional Limitation Mobility: Walking and moving around   Mobility: Walking and Moving Around Current Status ([id number]) At  least 20 percent but less than 40 percent impaired, limited or restricted   Mobility: Walking and Moving Around Goal Status [phone]) At least 20 percent but less than 40 percent impaired, limited or restricted       Problem List Patient Active Problem List   Diagnosis Date Noted  . Plantar fasciitis 05/05/2014  . Routine health maintenance 09/03/2011  . Diabetes mellitus type 2, uncomplicated [id number]/3241  . HYPERLIPIDEMIA 05/24/2010  . GOUT, UNSPECIFIED 05/24/2010  . Essential hypertension 05/24/2010  . DEGENERATIVE JOINT DISEASE, RIGHT SHOULDER 05/24/2010  . Bilateral low back pain without sciatica 05/24/2010   Laureen Abrahams, PT, DPT 05/08/2014 9:22 AM  [facility] [address] [place], [place], [address] Phone: [phone]   Fax:  [phone]

## 2014-05-08 NOTE — Telephone Encounter (Signed)
appts made and printed...td 

## 2014-05-14 ENCOUNTER — Ambulatory Visit: Payer: PPO

## 2014-05-18 ENCOUNTER — Ambulatory Visit: Payer: PPO | Admitting: Physical Therapy

## 2014-05-18 DIAGNOSIS — M545 Low back pain, unspecified: Secondary | ICD-10-CM

## 2014-05-18 NOTE — Therapy (Signed)
[place], [place], [address] Phone: [phone]   Fax:  [phone]  Physical Therapy Treatment  Patient Details  Name: Jeffery Valdez MRN: [id number] Date of Birth: 1951/04/12 Referring Provider:  Olga Millers, MD  Encounter Date: 05/18/2014      PT End of Session - 05/18/14 0927    Visit Number 2   Number of Visits 12   Date for PT Re-Evaluation 07/07/14   PT Start Time 0845   PT Stop Time 0927   PT Time Calculation (min) 42 min   Activity Tolerance Patient tolerated treatment well   Behavior During Therapy [facility] for tasks assessed/performed      Past Medical History  Diagnosis Date  . Hyperlipidemia   . Degenerative joint disease of right shoulder   . Degeneration of lumbar or lumbosacral intervertebral disc   . Gout, unspecified   . Essential hypertension   . Diabetes mellitus type 2, controlled     Past Surgical History  Procedure Laterality Date  . Lumbar laminectomy      '09  . Rotator cuff repair      Right '11  . Circumcision, non-newborn      There were no vitals taken for this visit.  Visit Diagnosis:  Midline low back pain without sciatica      Subjective Assessment - 05/18/14 0850    Symptoms Doing exercises 1-2 times a day. Pain is about the same (foot improved)   Limitations Standing;Walking;Sitting   How long can you sit comfortably? 30 min   How long can you stand comfortably? 45 min   How long can you walk comfortably? 0.5 miles due to plantar fasciitis   Diagnostic tests no recent imaging   Patient Stated Goals decrease pain in back, improve lumbar strength, return to walking   Currently in Pain? Yes   Pain Score 4    Pain Location Back   Pain Orientation Mid;Lower   Pain Descriptors / Indicators Aching;Dull   Pain Type Chronic pain   Pain Onset More than a month ago   Pain Frequency Constant   Aggravating Factors  sitting, standing upright for long  periods   Pain Relieving Factors repositioning                    [facility] Adult PT Treatment/Exercise - 05/18/14 0853    Lumbar Exercises: Stretches   Passive Hamstring Stretch 3 reps;30 seconds   Single Knee to Chest Stretch 3 reps;30 seconds  bil   Double Knee to Chest Stretch Limitations difficulty with breathing therefore performed single knee to chest instead   Lower Trunk Rotation 3 reps;30 seconds   ITB Stretch 3 reps;30 seconds   Piriformis Stretch 3 reps;30 seconds   Lumbar Exercises: Aerobic   Stationary Bike NuStep Level 5 x 8 min                PT Education - 05/18/14 0927    Education provided Yes   Education Details HEP adjustments and ITB stretch   Person(s) Educated Patient   Methods Explanation;Demonstration   Comprehension Verbalized understanding;Returned demonstration;Need further instruction             PT Long Term Goals - 05/18/14 1540    PT LONG TERM GOAL #1   Title independent with HEP (06/19/14)   Status On-going   PT LONG TERM GOAL #2   Title perform lumbar flexion without increase in pain for improved mobility (06/19/14)  Status On-going   PT LONG TERM GOAL #3   Title report ability to sit > 45 min without increase in pain (06/19/14)   Status On-going   PT LONG TERM GOAL #4   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  Will continue to benefit from PT to maximize function.   PT Next Visit Plan core and hip strengthening   Consulted and Agree with Plan of Care Patient        Problem List Patient Active Problem List   Diagnosis Date Noted  . Plantar fasciitis 05/05/2014  . Routine health maintenance 09/03/2011  . Diabetes mellitus type 2, uncomplicated 33/29/5188  .  HYPERLIPIDEMIA 05/24/2010  . GOUT, UNSPECIFIED 05/24/2010  . Essential hypertension 05/24/2010  . DEGENERATIVE JOINT DISEASE, RIGHT SHOULDER 05/24/2010  . Bilateral low back pain without sciatica 05/24/2010   Laureen Abrahams, PT, DPT 05/18/2014 9:29 AM  [facility] [address] [place], [place], [address] Phone: [phone]   Fax:  [phone]

## 2014-05-21 ENCOUNTER — Ambulatory Visit: Payer: PPO | Admitting: Physical Therapy

## 2014-05-21 DIAGNOSIS — M545 Low back pain, unspecified: Secondary | ICD-10-CM

## 2014-05-21 NOTE — Therapy (Signed)
[place], [place], [address] Phone: [phone]   Fax:  [phone]  Physical Therapy Treatment  Patient Details  Name: Jeffery Valdez MRN: [id number] Date of Birth: 1950-08-24 Referring Provider:  Olga Millers, MD  Encounter Date: 05/21/2014      PT End of Session - 05/21/14 1048    Visit Number 3   Number of Visits 12   Date for PT Re-Evaluation 07/07/14   PT Start Time 8828   PT Stop Time 1102   PT Time Calculation (min) 47 min   Activity Tolerance Patient tolerated treatment well   Behavior During Therapy [facility] for tasks assessed/performed      Past Medical History  Diagnosis Date  . Hyperlipidemia   . Degenerative joint disease of right shoulder   . Degeneration of lumbar or lumbosacral intervertebral disc   . Gout, unspecified   . Essential hypertension   . Diabetes mellitus type 2, controlled     Past Surgical History  Procedure Laterality Date  . Lumbar laminectomy      '09  . Rotator cuff repair      Right '11  . Circumcision, non-newborn      There were no vitals taken for this visit.  Visit Diagnosis:  Midline low back pain without sciatica      Subjective Assessment - 05/21/14 1019    Symptoms Pain is the same, reports it was better but "didn't sleep right."   Limitations Standing;Walking;Sitting   How long can you sit comfortably? 30 min   How long can you stand comfortably? 45 min   How long can you walk comfortably? 0.5 miles due to plantar fasciitis   Diagnostic tests no recent imaging   Patient Stated Goals decrease pain in back, improve lumbar strength, return to walking   Currently in Pain? Yes   Pain Score 5    Pain Location Back   Pain Orientation Mid;Lower   Pain Descriptors / Indicators Aching;Dull   Pain Type Chronic pain   Pain Onset More than a month ago   Pain Frequency Constant   Aggravating Factors  sitting, standing upright for long  periods   Pain Relieving Factors repositioning                    [facility] Adult PT Treatment/Exercise - 05/21/14 1021    Lumbar Exercises: Aerobic   Stationary Bike NuStep Level 6 x 8 min   Lumbar Exercises: Prone   Straight Leg Raise 10 reps;2 seconds   Straight Leg Raises Limitations propped on elbows due to hernia   Other Prone Lumbar Exercises prone on elbows 3x2 min   Modalities   Modalities Electrical Stimulation;Moist Heat   Moist Heat Therapy   Number Minutes Moist Heat 15 Minutes   Moist Heat Location Other (comment)  low back   Electrical Stimulation   Electrical Stimulation Location low back   Electrical Stimulation Action IFC   Electrical Stimulation Parameters to tolerance   Electrical Stimulation Goals Pain                     PT Long Term Goals - 05/21/14 1049    PT LONG TERM GOAL #1   Title independent with HEP (06/19/14)   Status On-going   PT LONG TERM GOAL #2   Title perform lumbar flexion without increase in pain for improved mobility (06/19/14)   Status On-going   PT LONG TERM GOAL #3  Title report ability to sit > 45 min without increase in pain (06/19/14)   Status On-going   PT LONG TERM GOAL #4   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  Will continue to benefit from PT to maximize function and decrease pain.   PT Next Visit Plan core and hip strengthening; continue modalities and extension based exercise if improved   Consulted and Agree with Plan of Care Patient        Problem List Patient Active Problem List   Diagnosis Date Noted  . Plantar fasciitis 05/05/2014  . Routine health maintenance 09/03/2011  . Diabetes mellitus type 2, uncomplicated 03/50/0938  . HYPERLIPIDEMIA 05/24/2010  .  GOUT, UNSPECIFIED 05/24/2010  . Essential hypertension 05/24/2010  . DEGENERATIVE JOINT DISEASE, RIGHT SHOULDER 05/24/2010  . Bilateral low back pain without sciatica 05/24/2010   Laureen Abrahams, PT, DPT 05/21/2014 11:09 AM  [facility] [address] [place], [place], [address] Phone: [phone]   Fax:  [phone]

## 2014-05-25 ENCOUNTER — Ambulatory Visit: Payer: PPO | Admitting: Physical Therapy

## 2014-05-25 DIAGNOSIS — M545 Low back pain, unspecified: Secondary | ICD-10-CM

## 2014-05-25 NOTE — Patient Instructions (Signed)
Abduction   Lift leg up toward ceiling. Return. Repeat __10__ times each leg. Do __2__ sessions per day.  http://[email or web address].exer.us/385   Copyright  VHI. All rights reserved.

## 2014-05-25 NOTE — Therapy (Signed)
[place], [place], [address] Phone: [phone]   Fax:  [phone]  Physical Therapy Treatment  Patient Details  Name: Jeffery Valdez MRN: [id number] Date of Birth: Jan 31, 1951 Referring Provider:  Olga Millers, MD  Encounter Date: 05/25/2014      PT End of Session - 05/25/14 0928    Visit Number 4   Number of Visits 12   Date for PT Re-Evaluation 07/07/14   PT Start Time 0832   PT Stop Time 0921   PT Time Calculation (min) 49 min   Activity Tolerance Patient tolerated treatment well  Hip abduction weakness noted bilaterally   Behavior During Therapy [facility] for tasks assessed/performed      Past Medical History  Diagnosis Date  . Hyperlipidemia   . Degenerative joint disease of right shoulder   . Degeneration of lumbar or lumbosacral intervertebral disc   . Gout, unspecified   . Essential hypertension   . Diabetes mellitus type 2, controlled     Past Surgical History  Procedure Laterality Date  . Lumbar laminectomy      '09  . Rotator cuff repair      Right '11  . Circumcision, non-newborn      There were no vitals taken for this visit.  Visit Diagnosis:  Midline low back pain without sciatica      Subjective Assessment - 05/25/14 0837    Symptoms Pain 4-5/10 in mid low back.  My pain hasn't really changed but my ROM is improved.   Limitations Standing;Walking;Sitting   Currently in Pain? Yes   Pain Score 5    Pain Location Back   Pain Orientation Mid   Pain Descriptors / Indicators Constant;Dull   Pain Type Chronic pain   Pain Radiating Towards right hip   Pain Onset More than a month ago   Pain Frequency Constant   Aggravating Factors  Sitting and standing long periods   Pain Relieving Factors Changing positions   Multiple Pain Sites No             [facility] Adult PT Treatment/Exercise - 05/25/14 0001    Exercises   Exercises Knee/Hip   Lumbar Exercises:  Stretches   Passive Hamstring Stretch 3 reps;30 seconds   Single Knee to Chest Stretch 3 reps;30 seconds  bil   Lower Trunk Rotation 2 reps;30 seconds  each side   Piriformis Stretch 3 reps;30 seconds   Lumbar Exercises: Aerobic   Stationary Bike NuStep Level 6 x 8 min   Lumbar Exercises: Supine   Bridge 10 reps;3 seconds   Knee/Hip Exercises: Sidelying   Hip ABduction AROM;Both;2 sets  x10   Ultrasound   Ultrasound Location bilateral upper lumbar paraspinals   Ultrasound Parameters 1 MHz, 1.0 w/cm2, 100% for 6 minutes in prone   Ultrasound Goals Pain      Verbal and tactile cues provided during exercises to ensure proper technique and avoid compensatory techniques. Bilateral hip abductor weakness noted throughout treatment.        PT Education - 05/25/14 0925    Education provided Yes   Education Details HEP sidelying hip abduction   Person(s) Educated Patient   Methods Explanation;Tactile cues;Verbal cues;Handout   Comprehension Verbalized understanding;Returned demonstration             PT Long Term Goals - 05/21/14 1049    PT LONG TERM GOAL #1   Title independent with HEP (06/19/14)   Status On-going   PT LONG  TERM GOAL #2   Title perform lumbar flexion without increase in pain for improved mobility (06/19/14)   Status On-going   PT LONG TERM GOAL #3   Title report ability to sit > 45 min without increase in pain (06/19/14)   Status On-going   PT LONG TERM GOAL #4   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  ROM appears to have improved but pain is unchanged.  He reported some relief from ultrasound today and will let us know next visit if it helped.  Would benefit from body mechanics education.   Pt will benefit from skilled therapeutic  intervention in order to improve on the following deficits Decreased strength;Decreased range of motion;Pain   Rehab Potential Good   Clinical Impairments Affecting Rehab Potential none   PT Frequency 2x / week   PT Duration 6 weeks   PT Treatment/Interventions ADLs/Self Care Home Management;Cryotherapy;Electrical Stimulation;Functional mobility training;Neuromuscular re-education;Ultrasound;Manual techniques;Passive range of motion;Therapeutic exercise;Traction;Contrast Bath;Therapeutic activities;Patient/family education   PT Next Visit Plan core and hip strengthening; continue modalities and extension based exercise if improved   Consulted and Agree with Plan of Care Patient        Problem List Patient Active Problem List   Diagnosis Date Noted  . Plantar fasciitis 05/05/2014  . Routine health maintenance 09/03/2011  . Diabetes mellitus type 2, uncomplicated 86/76/1950  . HYPERLIPIDEMIA 05/24/2010  . GOUT, UNSPECIFIED 05/24/2010  . Essential hypertension 05/24/2010  . DEGENERATIVE JOINT DISEASE, RIGHT SHOULDER 05/24/2010  . Bilateral low back pain without sciatica 05/24/2010    Annia Friendly, PT 05/25/2014, 9:39 AM  [facility] [address] [place], [place], [address] Phone: [phone]   Fax:  [phone]

## 2014-05-27 ENCOUNTER — Encounter: Payer: Self-pay | Admitting: Family Medicine

## 2014-05-27 ENCOUNTER — Other Ambulatory Visit (INDEPENDENT_AMBULATORY_CARE_PROVIDER_SITE_OTHER): Payer: PPO

## 2014-05-27 ENCOUNTER — Ambulatory Visit (INDEPENDENT_AMBULATORY_CARE_PROVIDER_SITE_OTHER): Payer: PPO | Admitting: Family Medicine

## 2014-05-27 VITALS — BP 136/70 | HR 56 | Ht 72.0 in | Wt 203.0 lb

## 2014-05-27 DIAGNOSIS — D2121 Benign neoplasm of connective and other soft tissue of right lower limb, including hip: Secondary | ICD-10-CM | POA: Diagnosis not present

## 2014-05-27 DIAGNOSIS — D2120 Benign neoplasm of connective and other soft tissue of unspecified lower limb, including hip: Secondary | ICD-10-CM | POA: Insufficient documentation

## 2014-05-27 DIAGNOSIS — M79671 Pain in right foot: Secondary | ICD-10-CM

## 2014-05-27 MED ORDER — DICLOFENAC SODIUM 2 % TD SOLN: TRANSDERMAL | Status: DC

## 2014-05-27 NOTE — Progress Notes (Signed)
[name] [place], [place] [address] Phone: [phone] Subjective:    I'm seeing this patient by the request  of:  Olga Millers, MD   CC: Right foot pain  Jeffery Valdez is a 64 y.o. male coming in with complaint of right foot pain. Patient sees of this pain seems to be hurting him more when he is walking. Patient has had plantar fasciitis before but states that this feels somewhat different. Patient states his been going on for approximately 2 years but seems to be getting worse. Patient states that he now can walk approximately a quarter of a mile and starts having pain. Patient has seen many different providers for this before and does have had orthotics as well as buying very expensive shoes. Patient states that this is helpful when he wears shoes. States though that he can not bear being barefoot at this time. Denies any swelling, any discoloration or any numbness that is associated with it. Patient is not tried any other home modalities for it. Patient states though that it is starting to affect some of his daily activities and he can no longer walk for exercise. Patient rates the severity of 6 out of 10.     Past medical history, social, surgical and family history all reviewed in electronic medical record.   Review of Systems: No headache, visual changes, nausea, vomiting, diarrhea, constipation, dizziness, abdominal pain, skin rash, fevers, chills, night sweats, weight loss, swollen lymph nodes, body aches, joint swelling, muscle aches, chest pain, shortness of breath, mood changes.   Objective Blood pressure 136/70, pulse 56, height 6' (1.829 m), weight 203 lb (92.08 kg), SpO2 98 %.  General: No apparent distress alert and oriented x3 mood and affect normal, dressed appropriately.  HEENT: Pupils equal, extraocular movements intact  Respiratory: Patient's speak in full sentences and does not appear short of breath    Cardiovascular: No lower extremity edema, non tender, no erythema  Skin: Warm dry intact with no signs of infection or rash on extremities or on axial skeleton.  Abdomen: Soft nontender  Neuro: Cranial nerves II through XII are intact, neurovascularly intact in all extremities with 2+ DTRs and 2+ pulses.  Lymph: No lymphadenopathy of posterior or anterior cervical chain or axillae bilaterally.  Gait normal with good balance and coordination.  MSK:  Non tender with full range of motion and good stability and symmetric strength and tone of shoulders, elbows, wrist, hip, knee and ankles bilaterally.  Foot exam shows patient does have pes planus bilaterally. Patient also has some mild breakdown of the transverse arch. Overpronation of the hindfoot bilaterally right greater than left.  MSK US performed of: Right foot This study was ordered, performed, and interpreted by Charlann Boxer D.O.  Foot/Ankle:   All structures visualized.   Talar dome normal osteophytic changes Ankle mortise without effusion. Peroneus longus and brevis tendons unremarkable on long and transverse views without sheath effusions. Posterior tibialis, flexor hallucis longus, and flexor digitorum longus tendons unremarkable on long and transverse views without sheath effusions. Achilles tendon visualized along length of tendon and unremarkable on long and transverse views without sheath effusion. Anterior Talofibular Ligament and Calcaneofibular Ligaments unremarkable and intact. Deltoid Ligament unremarkable and intact. Plantar fascia intact and without effusion, normal thickness. No increased doppler signal, cap sign, or thickening of tibial cortex. Just underlying patient's plantar fasciitis seems to be a potential fibroma. This is approximately 4 cm from origin over the  calcaneal region. This measures a proximally 0.35 cm in diameter. Power doppler signal normal.  IMPRESSION:  Fibroma  Procedure note 43837; 15 minutes  spent for Therapeutic exercises as stated in above notes.  This included exercises focusing on stretching, strengthening, with significant focus on eccentric aspects.   Proper technique shown and discussed handout in great detail with ATC.  All questions were discussed and answered.      Impression and Recommendations:     This case required medical decision making of moderate complexity.

## 2014-05-27 NOTE — Assessment & Plan Note (Signed)
Seems to be fibroma. May not be in the PF, likely more in plantaris.  Discussed icing. HEP, discussed shoes, given topical NSAIDs.  We discussed other over-the-counter medications a could be beneficial. Patient is to try to make these changes and come back in 3 weeks. If continuing to have pain we may consider ultrasound guided injection. We discussed that this likely will be a chronic problem in custom orthotics might be necessary as well.

## 2014-05-27 NOTE — Progress Notes (Signed)
Pre visit review using our clinic review tool, if applicable. No additional management support is needed unless otherwise documented below in the visit note. 

## 2014-05-27 NOTE — Patient Instructions (Addendum)
Very nice to meet you Ice bath 20 minutes at night.  Exercises 3 times a week.  Wear those good shoes.  Vitamin D 2000 IU daily Pennsaid twice daily.  Stop if it hurts your stomach but likely not going to.  See me again in 3 weeks and we can consider injection.

## 2014-05-28 ENCOUNTER — Ambulatory Visit: Payer: PPO | Admitting: Physical Therapy

## 2014-05-28 DIAGNOSIS — M545 Low back pain, unspecified: Secondary | ICD-10-CM

## 2014-05-28 NOTE — Therapy (Signed)
[place], [place], [address] Phone: [phone]   Fax:  [phone]  Physical Therapy Treatment  Patient Details  Name: Jeffery Valdez MRN: [id number] Date of Birth: [birth year] Referring Provider:  Olga Millers, MD  Encounter Date: 05/28/2014      PT End of Session - 05/28/14 9563    Visit Number 5   Number of Visits 12   Date for PT Re-Evaluation 07/07/14   PT Start Time 0845   PT Stop Time 0935   PT Time Calculation (min) 50 min   Activity Tolerance Patient tolerated treatment well   Behavior During Therapy [facility] for tasks assessed/performed      Past Medical History  Diagnosis Date  . Hyperlipidemia   . Degenerative joint disease of right shoulder   . Degeneration of lumbar or lumbosacral intervertebral disc   . Gout, unspecified   . Essential hypertension   . Diabetes mellitus type 2, controlled     Past Surgical History  Procedure Laterality Date  . Lumbar laminectomy      '09  . Rotator cuff repair      Right '11  . Circumcision, non-newborn      There were no vitals taken for this visit.  Visit Diagnosis:  Midline low back pain without sciatica      Subjective Assessment - 05/28/14 0849    Symptoms back is hurting today; pain the same; not a good week   Limitations Standing;Walking;Sitting   How long can you sit comfortably? 30 min   How long can you stand comfortably? 45 min   How long can you walk comfortably? 0.5 miles due to plantar fasciitis   Diagnostic tests no recent imaging   Patient Stated Goals decrease pain in back, improve lumbar strength, return to walking   Currently in Pain? Yes   Pain Score 5    Pain Location Back   Pain Orientation Mid   Pain Descriptors / Indicators Constant;Dull   Pain Type Chronic pain   Pain Radiating Towards right hip   Pain Onset More than a month ago   Pain Frequency Constant   Aggravating Factors  prolonged static  positioning   Pain Relieving Factors repositioning                    [facility] Adult PT Treatment/Exercise - 05/28/14 0850    Lumbar Exercises: Aerobic   Stationary Bike NuStep Level 6 x 8 min   Lumbar Exercises: Supine   Ab Set 10 reps;5 seconds   Clam 10 reps   Clam Limitations single limb hip abdct with green theraband x 10 reps bil   Knee/Hip Exercises: Sidelying   Clams green tband x 10 bil   Modalities   Modalities Electrical Stimulation;Moist Heat   Moist Heat Therapy   Number Minutes Moist Heat 15 Minutes   Moist Heat Location Other (comment)  low back   Electrical Stimulation   Electrical Stimulation Location low back   Electrical Stimulation Action IFC   Electrical Stimulation Parameters to tolerance   Electrical Stimulation Goals Pain                PT Education - 05/28/14 [phone]    Education provided Yes   Education Details possible transfer to closer PT clinic   Person(s) Educated Patient   Methods Explanation   Comprehension Verbalized understanding             PT Long Term Goals -  05/28/14 0924    PT LONG TERM GOAL #1   Title independent with HEP (06/19/14)   Status On-going   PT LONG TERM GOAL #2   Title perform lumbar flexion without increase in pain for improved mobility (06/19/14)   Status On-going   PT LONG TERM GOAL #3   Title report ability to sit > 45 min without increase in pain (06/19/14)   Status On-going   PT LONG TERM GOAL #4   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  Has had some soreness with strengthening exercises.  May want to transfer to [facility] approx 2 miles from home.   PT Next Visit Plan core and hip strengthening; continue modalities and extension based exercise if improved    Consulted and Agree with Plan of Care Patient        Problem List Patient Active Problem List   Diagnosis Date Noted  . Fibroma of foot 05/27/2014  . Plantar fasciitis 05/05/2014  . Routine health maintenance 09/03/2011  . Diabetes mellitus type 2, uncomplicated 82/80/0349  . HYPERLIPIDEMIA 05/24/2010  . GOUT, UNSPECIFIED 05/24/2010  . Essential hypertension 05/24/2010  . DEGENERATIVE JOINT DISEASE, RIGHT SHOULDER 05/24/2010  . Bilateral low back pain without sciatica 05/24/2010   Laureen Abrahams, PT, DPT 05/28/2014 9:39 AM  [facility] [address] [place], [place], [address] Phone: [phone]   Fax:  [phone]

## 2014-06-01 ENCOUNTER — Ambulatory Visit: Payer: PPO | Attending: Internal Medicine | Admitting: Physical Therapy

## 2014-06-01 DIAGNOSIS — M545 Low back pain, unspecified: Secondary | ICD-10-CM

## 2014-06-01 NOTE — Therapy (Addendum)
[place], [place], [address] Phone: [phone]   Fax:  [phone]  Physical Therapy Treatment  Patient Details  Name: Jeffery Valdez MRN: [id number] Date of Birth: [birth year] Referring Provider:  Olga Millers, MD  Encounter Date: 06/01/2014      PT End of Session - 06/01/14 0827    Visit Number 6   Number of Visits 12   Date for PT Re-Evaluation 07/07/14   PT Start Time 0800   PT Stop Time 0843   PT Time Calculation (min) 43 min   Activity Tolerance Patient tolerated treatment well;Patient limited by pain   Behavior During Therapy [facility] for tasks assessed/performed      Past Medical History  Diagnosis Date  . Hyperlipidemia   . Degenerative joint disease of right shoulder   . Degeneration of lumbar or lumbosacral intervertebral disc   . Gout, unspecified   . Essential hypertension   . Diabetes mellitus type 2, controlled     Past Surgical History  Procedure Laterality Date  . Lumbar laminectomy      '09  . Rotator cuff repair      Right '11  . Circumcision, non-newborn      There were no vitals taken for this visit.  Visit Diagnosis:  Midline low back pain without sciatica      Subjective Assessment - 06/01/14 0803    Symptoms feels like legs are getting weaker; leaning to left   Limitations Standing;Walking;Sitting   How long can you sit comfortably? 30 min   How long can you stand comfortably? 45 min   How long can you walk comfortably? 0.5 miles due to plantar fasciitis   Diagnostic tests no recent imaging   Patient Stated Goals decrease pain in back, improve lumbar strength, return to walking   Pain Score 0-No pain  episodes of sharp pain   Pain Location Back   Pain Orientation Mid   Pain Descriptors / Indicators Patsi Sears Adult PT Treatment/Exercise - 06/01/14 0804    Lumbar Exercises: Stretches   Press Ups 5 reps  with manual  correction of lateral shift   Lumbar Exercises: Aerobic   Stationary Bike NuStep L 4 x 5 min   Modalities   Modalities Traction   Traction   Type of Traction Lumbar   Min (lbs) 20   Max (lbs) 60   Hold Time 60   Rest Time 20   Time 15                PT Education - 06/01/14 0826    Education provided Yes   Education Details pt reports weakness in bil lower extremities; discussed possible return to MD if no improvement with traction   Person(s) Educated Patient   Methods Explanation   Comprehension Verbalized understanding             PT Long Term Goals - 06/01/14 3545    PT LONG TERM GOAL #1   Title independent with HEP (06/19/14)   Status On-going   PT LONG TERM GOAL #2   Title perform lumbar flexion without increase in pain for improved mobility (06/19/14)   Status On-going   PT LONG TERM GOAL #3   Title report ability to sit > 45 min without increase in pain (06/19/14)   Status On-going   PT LONG TERM GOAL #4  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  Trialed traction today to see if pt reports improvement in symptoms.  May need to return to MD for follow up if no improvement   Rehab Potential Good   PT Frequency 2x / week   PT Duration 6 weeks   PT Next Visit Plan follow up on traction response; continue as indicated   Consulted and Agree with Plan of Care Patient        Problem List Patient Active Problem List   Diagnosis Date Noted  . Fibroma of foot 05/27/2014  . Plantar fasciitis 05/05/2014  . Routine health maintenance 09/03/2011  . Diabetes mellitus type 2, uncomplicated 74/11/1446  . HYPERLIPIDEMIA 05/24/2010  . GOUT, UNSPECIFIED 05/24/2010  . Essential hypertension 05/24/2010  . DEGENERATIVE JOINT DISEASE, RIGHT  SHOULDER 05/24/2010  . Bilateral low back pain without sciatica 05/24/2010   Laureen Abrahams, PT, DPT 06/01/2014 8:44 AM  [facility] [address] [place], [place], [address] Phone: [phone]   Fax:  [phone]      PHYSICAL THERAPY DISCHARGE SUMMARY  Visits from Start of Care: 6  Current functional level related to goals / functional outcomes: See above goals not met and pt did not return   Remaining deficits: See above   Education / Equipment: HEP  Plan: Patient agrees to discharge.  Patient goals were not met. Patient is being discharged due to not returning since the last visit.  ?????    Laureen Abrahams, PT, DPT 08/06/2014 2:26 PM  [facility] [address],  [address]  [phone] (office) [phone] (fax)

## 2014-06-02 ENCOUNTER — Telehealth: Payer: Self-pay | Admitting: Internal Medicine

## 2014-06-04 ENCOUNTER — Ambulatory Visit (INDEPENDENT_AMBULATORY_CARE_PROVIDER_SITE_OTHER): Payer: PPO | Admitting: Internal Medicine

## 2014-06-04 ENCOUNTER — Ambulatory Visit: Payer: PPO

## 2014-06-04 ENCOUNTER — Other Ambulatory Visit: Payer: Self-pay | Admitting: Internal Medicine

## 2014-06-04 ENCOUNTER — Ambulatory Visit (INDEPENDENT_AMBULATORY_CARE_PROVIDER_SITE_OTHER)
Admission: RE | Admit: 2014-06-04 | Discharge: 2014-06-04 | Disposition: A | Discharge status: Home / Self Care | Payer: PPO | Source: Ambulatory Visit | Attending: Internal Medicine | Admitting: Internal Medicine

## 2014-06-04 ENCOUNTER — Encounter: Payer: Self-pay | Admitting: Internal Medicine

## 2014-06-04 VITALS — BP 162/100 | HR 63 | Temp 98.1°F | Resp 14 | Ht 72.0 in | Wt 204.0 lb

## 2014-06-04 DIAGNOSIS — M545 Low back pain, unspecified: Secondary | ICD-10-CM

## 2014-06-04 NOTE — Progress Notes (Signed)
   Subjective:    Patient ID: Jeffery Valdez, male    DOB: 1951/03/10, 64 y.o.   MRN: [id number]  HPI The patient is a 64 YO man who is coming in to follow up on back pain. He tried physical therapy as recommended but it is making him feel worse. He has throbbing pain in his right mid and lower back most of the day 5-6/10 intensity. Going on for 2-3 weeks. No associated fevers, chills, weight loss. His foot is doing better with sports medicine and the stretches I showed him (and also sports medicine reiterated).   Review of Systems  Constitutional: Negative for fever, activity change, appetite change, fatigue and unexpected weight change.  HENT: Negative.   Respiratory: Negative for cough, chest tightness, shortness of breath and wheezing.   Cardiovascular: Negative for chest pain, palpitations and leg swelling.  Gastrointestinal: Negative for abdominal pain, diarrhea, constipation, blood in stool and abdominal distention.  Endocrine: Negative.   Musculoskeletal: Positive for back pain and arthralgias. Negative for myalgias and gait problem.  Skin: Negative.   Neurological: Negative for dizziness, weakness, light-headedness and headaches.  Psychiatric/Behavioral: Negative.       Objective:   Physical Exam  Constitutional: He is oriented to person, place, and time. He appears well-developed and well-nourished.  HENT:  Head: Normocephalic and atraumatic.  Eyes: EOM are normal.  Neck: Normal range of motion.  Cardiovascular: Normal rate and regular rhythm.   Pulmonary/Chest: Effort normal and breath sounds normal. No respiratory distress. He has no wheezes. He has no rales.  Abdominal: Soft. Bowel sounds are normal. He exhibits no distension. There is no tenderness.  Musculoskeletal: He exhibits tenderness. He exhibits no edema.  Right mid paraspinal region with tenderness and lower lumbar spinal tenderness  Neurological: He is alert and oriented to person, place, and time. No cranial  nerve deficit.   Filed Vitals:   06/04/14 0838  BP: 162/100  Pulse: 63  Temp: 98.1 F (36.7 C)  TempSrc: Oral  Resp: 14  Height: 6' (1.829 m)  Weight: 204 lb (92.534 kg)  SpO2: 98%     Assessment & Plan:

## 2014-06-04 NOTE — Patient Instructions (Signed)
We will send you downstairs today to get an x-ray of the back to see if the arthritis is worse than it was in 2009. We will likely need to send you back to orthopedics and will get that moving. You should hear back about that in the next week, if you do not hear please call back the office.  In the meantime we can try heating pads to see if they give you any pain relief.

## 2014-06-04 NOTE — Progress Notes (Signed)
Pre visit review using our clinic review tool, if applicable. No additional management support is needed unless otherwise documented below in the visit note. 

## 2014-06-04 NOTE — Assessment & Plan Note (Signed)
Since no x-ray since 2009 which showed early signs of arthritis will repeat. Will likely need referral back to orthopedics for possible injection or evaluation and will place today. He can use the same voltaren gel on his back as his foot since he has recent PUD which would limit NSAID oral therapy.

## 2014-06-12 ENCOUNTER — Ambulatory Visit (INDEPENDENT_AMBULATORY_CARE_PROVIDER_SITE_OTHER): Payer: PPO | Admitting: Internal Medicine

## 2014-06-12 ENCOUNTER — Encounter: Payer: Self-pay | Admitting: Internal Medicine

## 2014-06-12 VITALS — BP 152/80 | HR 77 | Temp 98.3°F | Wt 204.0 lb

## 2014-06-12 DIAGNOSIS — M546 Pain in thoracic spine: Secondary | ICD-10-CM

## 2014-06-12 MED ORDER — CYCLOBENZAPRINE HCL 5 MG PO TABS: 5.0000 mg | ORAL_TABLET | Freq: Three times a day (TID) | ORAL | Status: DC | PRN

## 2014-06-12 NOTE — Patient Instructions (Signed)
We think that you have some muscle strain in the upper back as well as some arthritis. We will try a muscle relaxer on the back called flexeril. You can take 1 pill up to 3 times a day for the pain. The muscles should calm back to normal in the next 1-2 weeks. In the meantime it is important to stay active as able and to work on some stretching with the back.   Back Exercises Back exercises help treat and prevent back injuries. The goal is to increase your strength in your belly (abdominal) and back muscles. These exercises can also help with flexibility. Start these exercises when told by your doctor. HOME CARE Back exercises include: Pelvic Tilt.  Lie on your back with your knees bent. Tilt your pelvis until the lower part of your back is against the floor. Hold this position 5 to 10 sec. Repeat this exercise 5 to 10 times. Knee to Chest.  Pull 1 knee up against your chest and hold for 20 to 30 seconds. Repeat this with the other knee. This may be done with the other leg straight or bent, whichever feels better. Then, pull both knees up against your chest. Sit-Ups or Curl-Ups.  Bend your knees 90 degrees. Start with tilting your pelvis, and do a partial, slow sit-up. Only lift your upper half 30 to 45 degrees off the floor. Take at least 2 to 3 seonds for each sit-up. Do not do sit-ups with your knees out straight. If partial sit-ups are difficult, simply do the above but with only tightening your belly (abdominal) muscles and holding it as told. Hip-Lift.  Lie on your back with your knees flexed 90 degrees. Push down with your feet and shoulders as you raise your hips 2 inches off the floor. Hold for 10 seconds, repeat 5 to 10 times. Back Arches.  Lie on your stomach. Prop yourself up on bent elbows. Slowly press on your hands, causing an arch in your low back. Repeat 3 to 5 times. Shoulder-Lifts.  Lie face down with arms beside your body. Keep hips and belly pressed to floor as you slowly  lift your head and shoulders off the floor. Do not overdo your exercises. Be careful in the beginning. Exercises may cause you some mild back discomfort. If the pain lasts for more than 15 minutes, stop the exercises until you see your doctor. Improvement with exercise for back problems is slow.  Document Released: 05/20/2010 Document Revised: 07/10/2011 Document Reviewed: 02/16/2011 [facility] Patient Information 2015 [place], [place]. This information is not intended to replace advice given to you by your health care provider. Make sure you discuss any questions you have with your health care provider.

## 2014-06-12 NOTE — Assessment & Plan Note (Signed)
Pain is midline as well as left. Advised him to stay active and work on stretching. Will try muscle relaxer flexeril since he is unable to take NSAIDs. He can also try to use the topical NSAID.

## 2014-06-12 NOTE — Progress Notes (Signed)
   Subjective:    Patient ID: PARRY PO, male    DOB: 1950-08-22, 64 y.o.   MRN: [id number]  HPI The patient is a 64 YO man who is coming in today for mid to high back pain. Last week he was seen for low back pain and this has improved dramatically with the topical pain medicine. He then started getting high back pain on Tuesday. He denies any inciting incident or extra strain. He was not lifting heavy things. He rates the pain about 6/10 when severe. He notices that sitting or standing for long periods of time makes the pain worse and moving around makes it a little better. He has tried putting some of the cream on it and it did not help as dramatically. He denies fevers, chills, chest pains.   Review of Systems  Constitutional: Negative for fever, activity change, appetite change, fatigue and unexpected weight change.  Respiratory: Negative for cough, chest tightness, shortness of breath and wheezing.   Cardiovascular: Negative for chest pain, palpitations and leg swelling.  Gastrointestinal: Negative for abdominal pain, diarrhea, constipation, blood in stool and abdominal distention.  Endocrine: Negative.   Musculoskeletal: Positive for back pain and arthralgias. Negative for myalgias and gait problem.  Skin: Negative.   Neurological: Negative for dizziness, weakness, light-headedness and headaches.  Psychiatric/Behavioral: Negative.       Objective:   Physical Exam  Constitutional: He is oriented to person, place, and time. He appears well-developed and well-nourished.  HENT:  Head: Normocephalic and atraumatic.  Eyes: EOM are normal.  Neck: Normal range of motion.  Cardiovascular: Normal rate and regular rhythm.   Pulmonary/Chest: Effort normal and breath sounds normal.  Abdominal: Soft. He exhibits no distension. There is no tenderness.  Musculoskeletal: He exhibits tenderness. He exhibits no edema.  Low back without tenderness, upper thoracic region with some spinal  tenderness and over the left scapula.   Neurological: He is alert and oriented to person, place, and time. No cranial nerve deficit.  Skin: Skin is warm and dry.   Filed Vitals:   06/12/14 0754  BP: 152/80  Pulse: 77  Temp: 98.3 F (36.8 C)  TempSrc: Oral  Weight: 204 lb (92.534 kg)  SpO2: 97%      Assessment & Plan:

## 2014-06-12 NOTE — Progress Notes (Signed)
Pre visit review using our clinic review tool, if applicable. No additional management support is needed unless otherwise documented below in the visit note. 

## 2014-06-16 ENCOUNTER — Ambulatory Visit (INDEPENDENT_AMBULATORY_CARE_PROVIDER_SITE_OTHER): Payer: PPO | Admitting: Family Medicine

## 2014-06-16 ENCOUNTER — Encounter: Payer: Self-pay | Admitting: Family Medicine

## 2014-06-16 VITALS — BP 144/80 | HR 80 | Ht 72.0 in | Wt 207.0 lb

## 2014-06-16 DIAGNOSIS — D2121 Benign neoplasm of connective and other soft tissue of right lower limb, including hip: Secondary | ICD-10-CM

## 2014-06-16 NOTE — Patient Instructions (Signed)
Good to see you Continue with the ice.  We will get you sized for custom orthotics.  Continue the exercises and the pennsaid See you again about 3-4 weeks after orthotics.

## 2014-06-16 NOTE — Progress Notes (Signed)
  Jeffery Valdez, Jeffery Valdez Phone: [phone] Subjective:    I'm seeing this patient by the request  of:  Jeffery Millers, MD   CC: Right foot pain  Jeffery Valdez is a 64 y.o. male coming in with complaint of right foot pain. Patient was found to have a potential fibroma and plantar fasciitis. Patient was given home exercises, we discussed icing protocol, we discussed over-the-counter orthotics. Patient has been doing the exercises fairly regularly and states he is proximally 60% better. As he wears good shoes seem notices he has been doing better. Patient has been using the topical anti-inflammatory as well. Patient states that he would continue improvement daily.     Past medical history, social, surgical and family history all reviewed in electronic medical record.   Review of Systems: No headache, visual changes, nausea, vomiting, diarrhea, constipation, dizziness, abdominal pain, skin rash, fevers, chills, night sweats, weight loss, swollen lymph nodes, body aches, joint swelling, muscle aches, chest pain, shortness of breath, mood changes.   Objective Blood pressure 144/80, pulse 80, height 6' (1.829 m), weight 207 lb (93.895 kg), SpO2 99 %.  General: No apparent distress alert and oriented x3 mood and affect normal, dressed appropriately.  HEENT: Pupils equal, extraocular movements intact  Respiratory: Patient's speak in full sentences and does not appear short of breath  Cardiovascular: No lower extremity edema, non tender, no erythema  Skin: Warm dry intact with no signs of infection or rash on extremities or on axial skeleton.  Abdomen: Soft nontender  Neuro: Cranial nerves II through XII are intact, neurovascularly intact in all extremities with 2+ DTRs and 2+ pulses.  Lymph: No lymphadenopathy of posterior or anterior cervical chain or axillae bilaterally.  Gait normal with good balance and  coordination.  MSK:  Non tender with full range of motion and good stability and symmetric strength and tone of shoulders, elbows, wrist, hip, knee and ankles bilaterally.  Foot exam shows patient does have pes planus bilaterally. Patient also has some mild breakdown of the transverse arch. Overpronation of the hindfoot bilaterally right greater than left. Nontender on exam today. Neurovascularly intact distally.  MSK US performed of: Right foot This study was ordered, performed, and interpreted by Charlann Boxer D.O.  Foot/Ankle:   All structures visualized.   Talar dome normal osteophytic changes Ankle mortise without effusion. Peroneus longus and brevis tendons unremarkable on long and transverse views without sheath effusions. Posterior tibialis, flexor hallucis longus, and flexor digitorum longus tendons unremarkable on long and transverse views without sheath effusions. Achilles tendon visualized along length of tendon and unremarkable on long and transverse views without sheath effusion. Anterior Talofibular Ligament and Calcaneofibular Ligaments unremarkable and intact. Deltoid Ligament unremarkable and intact. Plantar fascia intact and without effusion, normal thickness. No increased doppler signal, cap sign, or thickening of tibial cortex. Just underlying patient's plantar fasciitis seems to be a potential fibroma. This is approximately 4 cm from origin over the calcaneal region. This measures a proximally 0.32 cm in diameter. Power doppler signal normal.  IMPRESSION:  Fibroma still present but significant decrease in effusion       Impression and Recommendations:     This case required medical decision making of moderate complexity.

## 2014-06-16 NOTE — Progress Notes (Signed)
Pre visit review using our clinic review tool, if applicable. No additional management support is needed unless otherwise documented below in the visit note. 

## 2014-06-16 NOTE — Assessment & Plan Note (Signed)
Patient is doing very well with conservative therapy. Due to patient's pes planus do think that he is an increased likelihood of having an increasing size in the fibroma. Patient will be fitted for custom orthotics in the coming days. We discussed continuing the icing regimen in the good shoes. Patient will continue this as well as the topical anti-inflammatory. His lungs patient does well with the conservative therapy will continue. Patient will follow-up with me 3-4 weeks after having the orthotics and we will discuss how patient is doing. If he has any worsening symptoms we will consider injection.  Spent  25 minutes with patient face-to-face and had greater than 50% of counseling including as described above in assessment and plan.

## 2014-06-29 ENCOUNTER — Encounter: Payer: Self-pay | Admitting: Family Medicine

## 2014-06-29 ENCOUNTER — Ambulatory Visit (INDEPENDENT_AMBULATORY_CARE_PROVIDER_SITE_OTHER): Payer: PPO | Admitting: Family Medicine

## 2014-06-29 DIAGNOSIS — D2121 Benign neoplasm of connective and other soft tissue of right lower limb, including hip: Secondary | ICD-10-CM

## 2014-06-29 NOTE — Progress Notes (Signed)
Pre visit review using our clinic review tool, if applicable. No additional management support is needed unless otherwise documented below in the visit note. 

## 2014-06-29 NOTE — Patient Instructions (Signed)
Verbal instructions given Wear orthotics 2 hours the first day then increase

## 2014-06-29 NOTE — Progress Notes (Signed)
  Procedure Note   Patient was fitted for a : standard, cushioned, semi-rigid orthotic. The orthotic was heated and afterward the patient patient seated position and molded The patient was positioned in subtalar neutral position and 10 degrees of ankle dorsiflexion in a weight bearing stance. After completion of molding, patient did have orthotic management The blank was ground to a stable position for weight bearing. Size:10 Base: Carbon fiber Additional Posting and Padding: medial posting with metatarsal pad, attempted to float medial arch for fibroma.  The patient ambulated these, and they were very comfortable. I spent 45 minutes with this patient, greater than 50% was face-to-face time counseling regarding the below diagnosis.

## 2014-06-29 NOTE — Assessment & Plan Note (Signed)
Patient was placed in custom orthotics today and we'll slowly increase the wear over the course of time. We discussed icing regimen and continuing the home exercises. Patient can follow-up with me again in 2-4 weeks for further evaluation and treatment.

## 2014-06-30 ENCOUNTER — Telehealth: Payer: Self-pay

## 2014-06-30 NOTE — Telephone Encounter (Signed)
06/30/14 Received Disc from [facility] and filed on [place].

## 2014-09-01 ENCOUNTER — Telehealth: Payer: Self-pay

## 2014-09-01 NOTE — Telephone Encounter (Signed)
09/01/14 Copied L-Spine on disc for ROI and gave to [place].Britt Bottom

## 2014-09-18 ENCOUNTER — Telehealth: Payer: Self-pay | Admitting: Internal Medicine

## 2014-09-18 MED ORDER — GABAPENTIN 400 MG PO CAPS: 400.0000 mg | ORAL_CAPSULE | Freq: Two times a day (BID) | ORAL | Status: DC

## 2014-09-18 MED ORDER — METFORMIN HCL 500 MG PO TABS: 500.0000 mg | ORAL_TABLET | Freq: Two times a day (BID) | ORAL | Status: AC

## 2014-09-18 MED ORDER — LOSARTAN POTASSIUM 100 MG PO TABS: 100.0000 mg | ORAL_TABLET | Freq: Every day | ORAL | Status: DC

## 2014-09-18 MED ORDER — FUROSEMIDE 40 MG PO TABS
40.0000 mg | ORAL_TABLET | Freq: Every day | ORAL | Status: DC
Start: 2014-09-18 — End: 2014-11-09

## 2014-09-18 NOTE — Telephone Encounter (Signed)
Patient is requesting Dr. Doug Sou to start filling gabapentin, losartan, furosemide and Metformin.  Patient is requesting these scripts to be sent to [facility].  Patient states he can get cheaper through [facility] that [place] now.

## 2014-09-18 NOTE — Telephone Encounter (Signed)
Notified pt meds sent to [facility]...Jeffery Valdez

## 2014-11-09 ENCOUNTER — Other Ambulatory Visit: Payer: Self-pay

## 2014-11-09 ENCOUNTER — Telehealth: Payer: Self-pay | Admitting: Internal Medicine

## 2014-11-09 MED ORDER — FUROSEMIDE 40 MG PO TABS: 40.0000 mg | ORAL_TABLET | Freq: Every day | ORAL | Status: DC

## 2014-11-09 NOTE — Telephone Encounter (Signed)
done

## 2014-11-09 NOTE — Telephone Encounter (Signed)
Verified pharmacy is [facility]. Patient is requesting a fill of furosemide (LASIX) 40 MG tablet [[id number]]. Advised OV may be needed for f/u on maintenance meds. He advised he would only come in if told by dr Doug Sou

## 2015-02-16 IMAGING — CR DG LUMBAR SPINE COMPLETE 4+V
5 series · 5 of 5 positions shown · non-contrast
Comparison: Perioperative films January 01, 2008 and
preoperative study of the same date.

CLINICAL DATA: Chronic low back and bilateral leg pain worsening
over the past 2 weeks; no recent injury; history of lumbar
laminectomy in 5552

EXAM:
LUMBAR SPINE - COMPLETE 4+ VIEW

[view not recorded (1 of 5)]
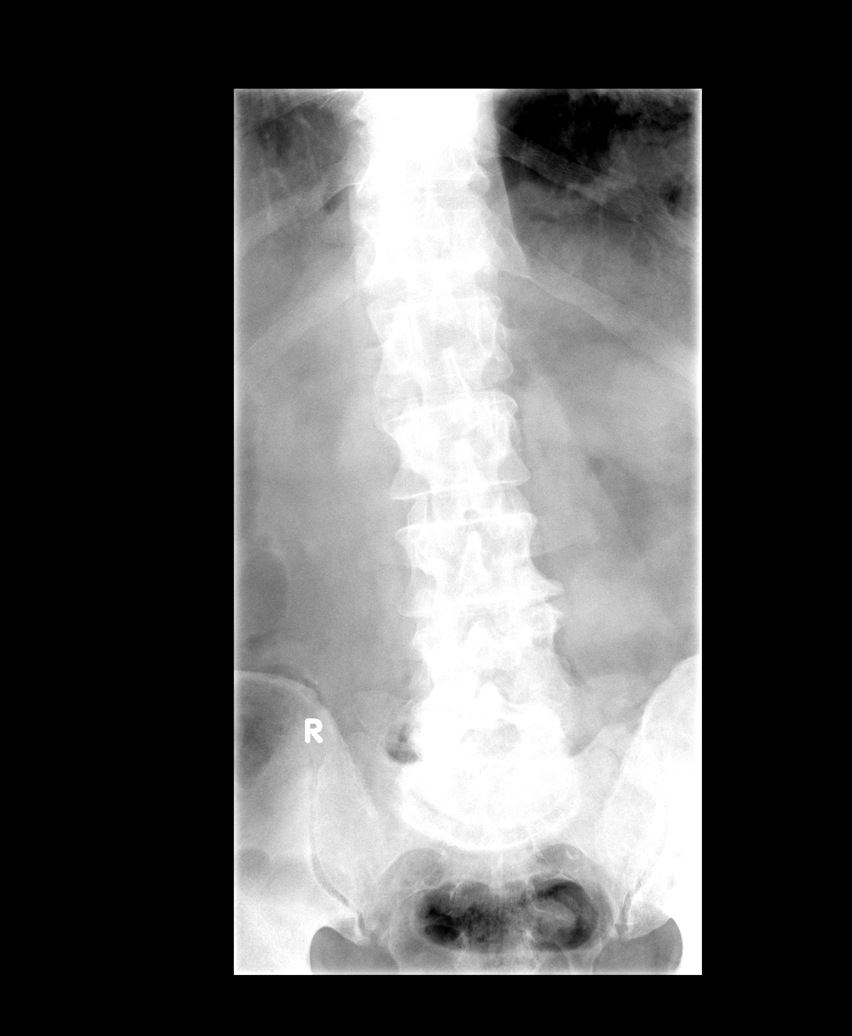

[view not recorded (2 of 5)]
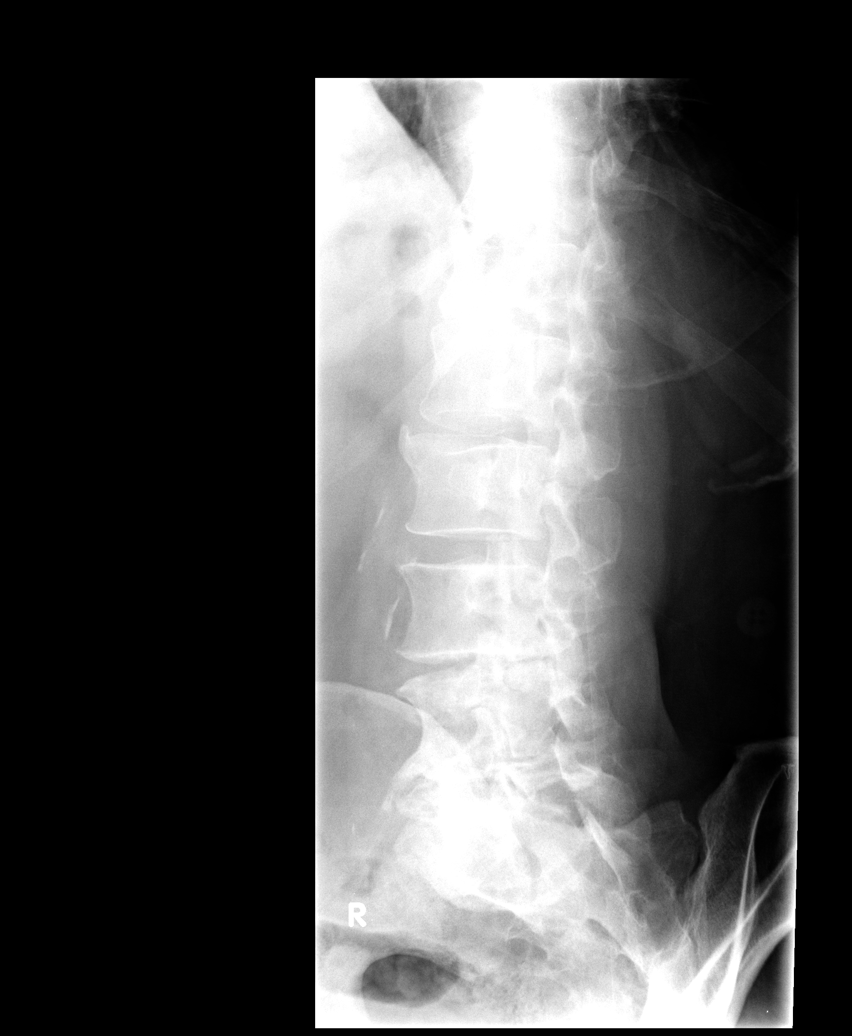

[view not recorded (3 of 5)]
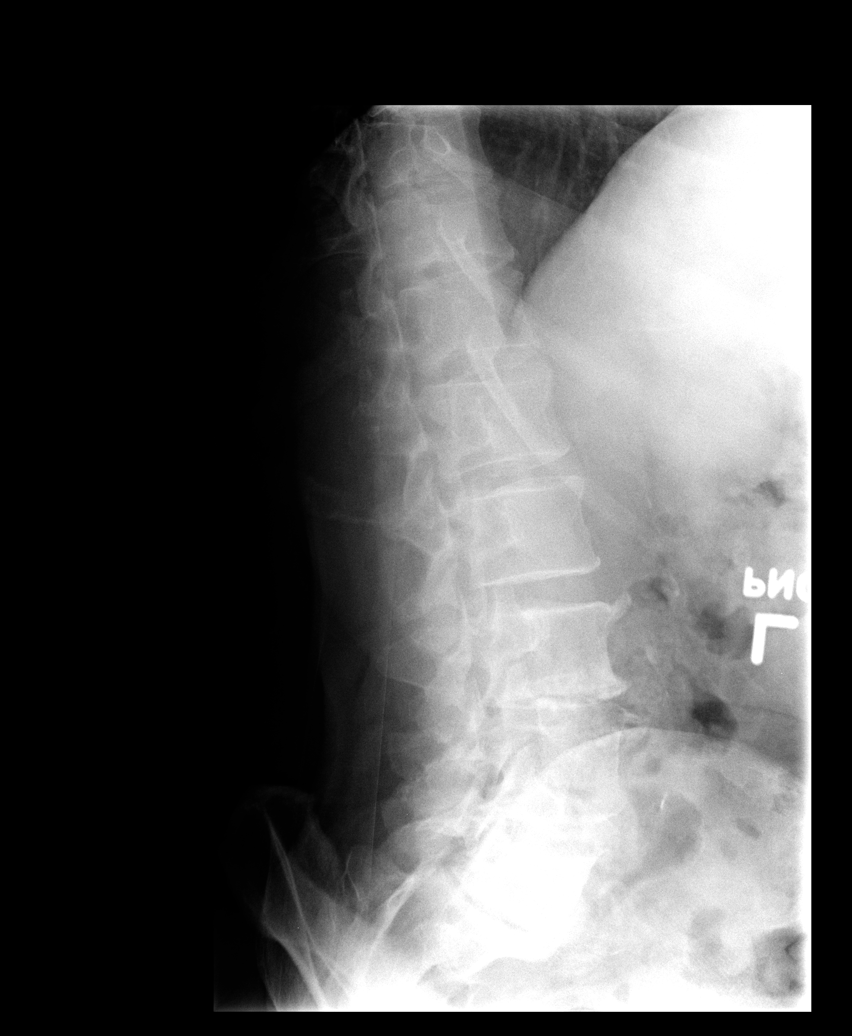

[view not recorded (4 of 5)]
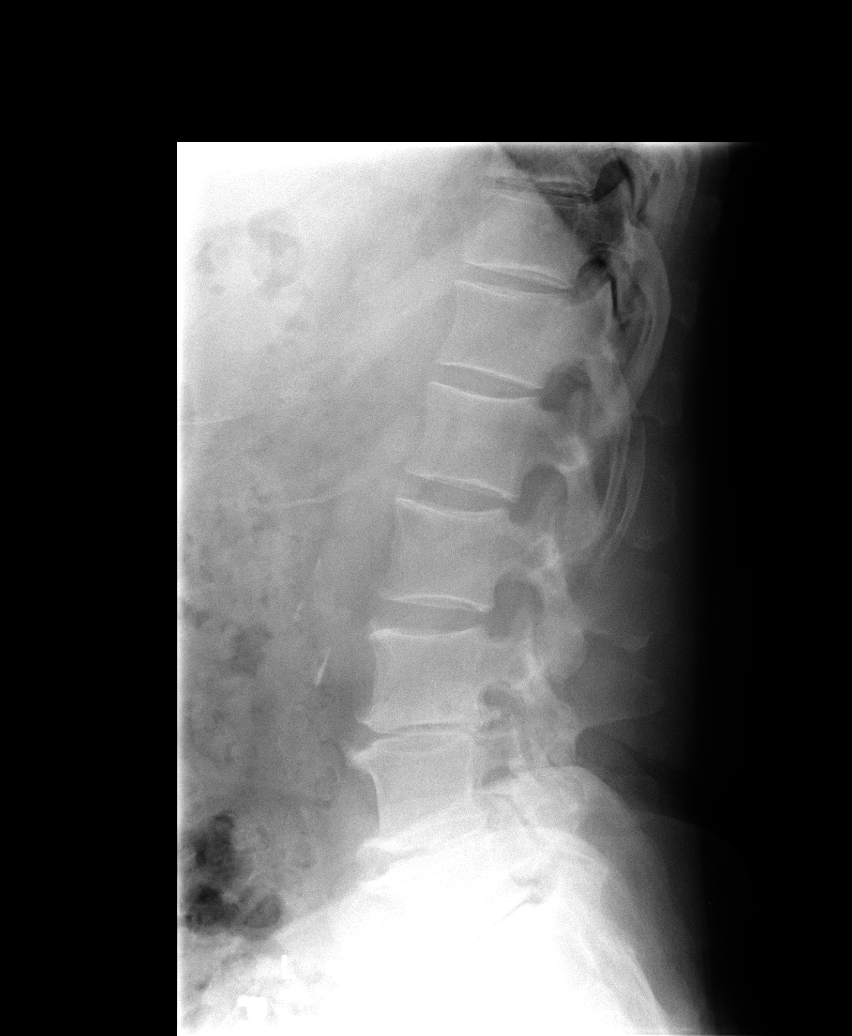

[view not recorded (5 of 5)]
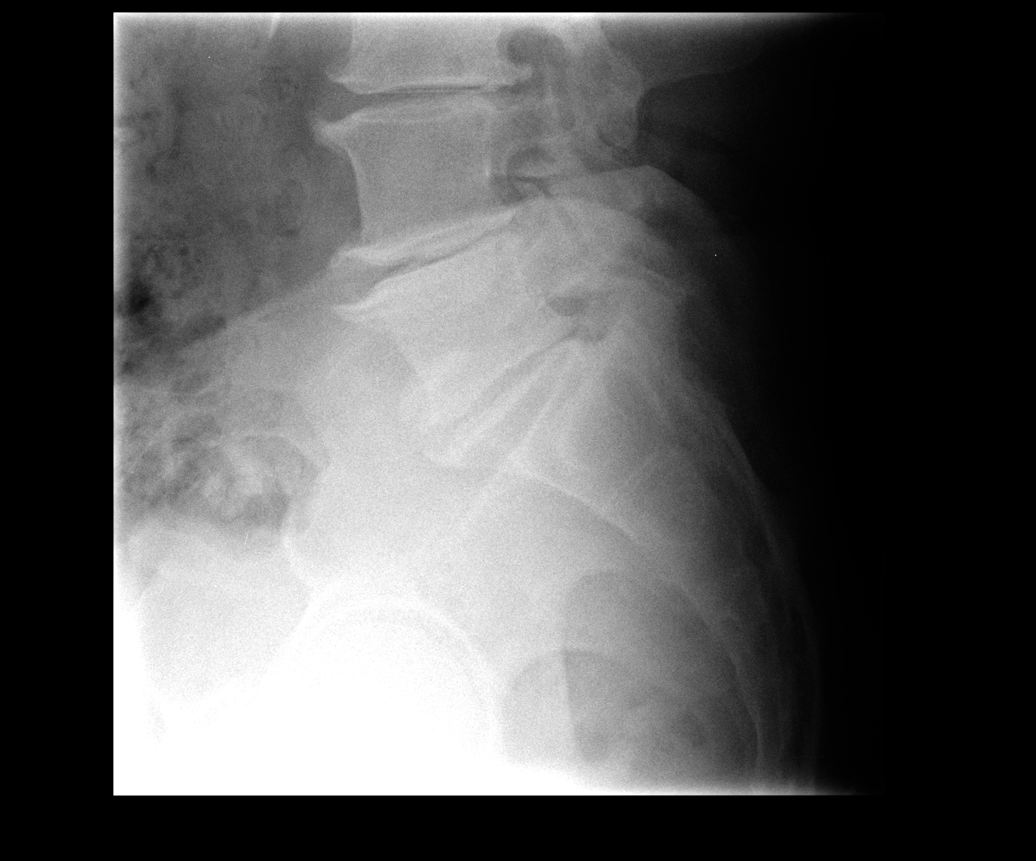

[5 of 5 positions shown; findings below may reference images not displayed]

FINDINGS: The lumbar vertebral bodies are preserved in height. There is
moderate disc space narrowing at L3-4, L4-5, and L5-S1. These
findings have progressed since the previous study. There are
anterior endplate osteophytes at these levels as well. There is no
spondylolisthesis. There is facet joint hypertrophy at L4-5 and at
L5-S1. The pedicles and transverse processes are grossly intact. The
observed portions of the sacrum are normal.
IMPRESSION: No acute bony abnormality is demonstrated. There is mild has been
mild progression of the degenerative disc disease at L3-4, L4-5, and
L5-S1.

## 2015-04-13 ENCOUNTER — Encounter: Payer: Self-pay | Admitting: Family Medicine

## 2015-04-13 ENCOUNTER — Ambulatory Visit (INDEPENDENT_AMBULATORY_CARE_PROVIDER_SITE_OTHER): Payer: PPO | Admitting: Family Medicine

## 2015-04-13 ENCOUNTER — Other Ambulatory Visit (INDEPENDENT_AMBULATORY_CARE_PROVIDER_SITE_OTHER): Payer: PPO

## 2015-04-13 VITALS — BP 138/84 | HR 78 | Ht 72.0 in | Wt 202.0 lb

## 2015-04-13 DIAGNOSIS — M79671 Pain in right foot: Secondary | ICD-10-CM | POA: Diagnosis not present

## 2015-04-13 DIAGNOSIS — D2121 Benign neoplasm of connective and other soft tissue of right lower limb, including hip: Secondary | ICD-10-CM

## 2015-04-13 MED ORDER — DICLOFENAC SODIUM 2 % TD SOLN: TRANSDERMAL | Status: AC

## 2015-04-13 NOTE — Assessment & Plan Note (Signed)
Patient was given an injection today with complete resolution of pain. We discussed that patient needs to monitor his feet secondary to him being a diabetic. I'm hoping that this will be beneficial. Made some changes to his orthotic. We discussed continuing to avoid being barefoot. Discussed how the prognosis of this problem is. Patient come back and see me again in 3-4 weeks for further evaluation and treatment.  Spent  25 minutes with patient face-to-face and had greater than 50% of counseling including as described above in assessment and plan.

## 2015-04-13 NOTE — Progress Notes (Signed)
Pre visit review using our clinic review tool, if applicable. No additional management support is needed unless otherwise documented below in the visit note. 

## 2015-04-13 NOTE — Progress Notes (Signed)
[name] [place], [place] [address] Phone: [phone] Subjective:    I'm seeing this patient by the request  of:  Hoyt Koch, MD   CC: Right foot pain follow up  [id number] Jeffery Valdez is a 64 y.o. male coming in with complaint of right foot pain. Patient was found to have a potential fibroma and plantar fasciitis. Patient putting orthotics. Patient had been doing very good. Continue on gabapentin. Stated though he's been working out more and has noticed increasing pain again on the plantar aspect of the foot. States that it seems to be hurting more with walking. States that it seems to be getting worse on the week. Denies any increasing numbness from previously. Has been feeling the bottom of his foot and feels like the fibroma is getting larger..     Past medical history, social, surgical and family history all reviewed in electronic medical record.   Review of Systems: No headache, visual changes, nausea, vomiting, diarrhea, constipation, dizziness, abdominal pain, skin rash, fevers, chills, night sweats, weight loss, swollen lymph nodes, body aches, joint swelling, muscle aches, chest pain, shortness of breath, mood changes.   Objective Blood pressure 138/84, pulse 78, height 6' (1.829 m), weight 202 lb (91.627 kg), SpO2 97 %.  General: No apparent distress alert and oriented x3 mood and affect normal, dressed appropriately.  HEENT: Pupils equal, extraocular movements intact  Respiratory: Patient's speak in full sentences and does not appear short of breath  Cardiovascular: No lower extremity edema, non tender, no erythema  Skin: Warm dry intact with no signs of infection or rash on extremities or on axial skeleton.  Abdomen: Soft nontender  Neuro: Cranial nerves II through XII are intact, neurovascularly intact in all extremities with 2+ DTRs and 2+ pulses.  Lymph: No lymphadenopathy of posterior or anterior cervical  chain or axillae bilaterally.  Gait normal with good balance and coordination.  MSK:  Non tender with full range of motion and good stability and symmetric strength and tone of shoulders, elbows, wrist, hip, knee and ankles bilaterally.  Foot exam shows patient does have pes planus bilaterally. Patient also has some mild breakdown of the transverse arch. Overpronation of the hindfoot bilaterally right greater than left. Nontender on exam today. Neurovascularly intact distally.  MSK US performed of: Right foot This study was ordered, performed, and interpreted by Charlann Boxer D.O.  Foot/Ankle:   All structures visualized.   Talar dome normal osteophytic changes Ankle mortise without effusion. Peroneus longus and brevis tendons unremarkable on long and transverse views without sheath effusions. Posterior tibialis, flexor hallucis longus, and flexor digitorum longus tendons unremarkable on long and transverse views without sheath effusions. Achilles tendon visualized along length of tendon and unremarkable on long and transverse views without sheath effusion. Anterior Talofibular Ligament and Calcaneofibular Ligaments unremarkable and intact. Deltoid Ligament unremarkable and intact. Plantar fascia intact patient does have a fibroma noted on the plantar aspect of her. Increasing hypoechoic changes and mild enlargement of 1.58 cm.  IMPRESSION:  Fibroma still present with significant effusion  Procedure: Real-time Ultrasound Guided Injection of plantar fibroma on the right foot Device: GE Logiq E  Ultrasound guided injection is preferred based studies that show increased duration, increased effect, greater accuracy, decreased procedural pain, increased response rate, and decreased cost with ultrasound guided versus blind injection.  Verbal informed consent obtained.  Time-out conducted.  Noted no overlying erythema, induration, or other signs of local infection.  Skin  prepped in a sterile  fashion.  Local anesthesia: Topical Ethyl chloride.  With sterile technique and under real time ultrasound guidance:  With a 25-gauge 1 inch needle patient was injected with a total of 0.5 mL of 0.5% Marcaine and 0.5 mL of Kenalog 40 mg/dL. Completed without difficulty  Pain immediately resolved suggesting accurate placement of the medication.  Advised to call if fevers/chills, erythema, induration, drainage, or persistent bleeding.  Images permanently stored and available for review in the ultrasound unit.  Impression: Technically successful ultrasound guided injection.     Impression and Recommendations:     This case required medical decision making of moderate complexity.

## 2015-04-13 NOTE — Patient Instructions (Signed)
Good to see you Ice in 6 hours Stay active Made change with the orthotic.  We injected the fibroma and we will see how it does Can repeat every 3 months if needed Happy holidays! See me in 3-4 weeks.

## 2015-04-23 ENCOUNTER — Telehealth: Payer: Self-pay | Admitting: Internal Medicine

## 2015-04-28 ENCOUNTER — Telehealth: Payer: Self-pay

## 2015-04-28 NOTE — Telephone Encounter (Signed)
Call to discuss AWV prior to CPE on 1/6 at 8:30; Can see at 8am if able to come in. LVM for call back

## 2015-04-28 NOTE — Telephone Encounter (Signed)
This patient called back in and can come in at 8am on 1/6 for AWV; will see Dr. Sharlet Salina at 8:30

## 2015-05-04 ENCOUNTER — Ambulatory Visit: Payer: PPO | Admitting: Family Medicine

## 2015-05-05 DIAGNOSIS — M47816 Spondylosis without myelopathy or radiculopathy, lumbar region: Secondary | ICD-10-CM | POA: Diagnosis not present

## 2015-05-05 DIAGNOSIS — M961 Postlaminectomy syndrome, not elsewhere classified: Secondary | ICD-10-CM | POA: Diagnosis not present

## 2015-05-05 DIAGNOSIS — M5136 Other intervertebral disc degeneration, lumbar region: Secondary | ICD-10-CM | POA: Diagnosis not present

## 2015-05-06 ENCOUNTER — Telehealth: Payer: Self-pay

## 2015-05-06 NOTE — Telephone Encounter (Signed)
Left message advising patient that Jeffery Valdez is out of office tomorrow (friday 1/6) and will not be able to do AWV before patients appt for physical, no need to come in early for AWV, but still come in for physical with PCP

## 2015-05-07 ENCOUNTER — Encounter: Payer: Self-pay | Admitting: Internal Medicine

## 2015-05-07 ENCOUNTER — Ambulatory Visit (INDEPENDENT_AMBULATORY_CARE_PROVIDER_SITE_OTHER): Payer: PPO | Admitting: Internal Medicine

## 2015-05-07 ENCOUNTER — Ambulatory Visit: Payer: PPO | Admitting: Family Medicine

## 2015-05-07 VITALS — BP 138/82 | HR 70 | Temp 98.0°F | Ht 72.0 in | Wt 202.0 lb

## 2015-05-07 DIAGNOSIS — Z0000 Encounter for general adult medical examination without abnormal findings: Secondary | ICD-10-CM | POA: Diagnosis not present

## 2015-05-07 DIAGNOSIS — I10 Essential (primary) hypertension: Secondary | ICD-10-CM

## 2015-05-07 DIAGNOSIS — E119 Type 2 diabetes mellitus without complications: Secondary | ICD-10-CM

## 2015-05-07 DIAGNOSIS — Z23 Encounter for immunization: Secondary | ICD-10-CM | POA: Diagnosis not present

## 2015-05-07 NOTE — Progress Notes (Signed)
Pre visit review using our clinic review tool, if applicable. No additional management support is needed unless otherwise documented below in the visit note. 

## 2015-05-07 NOTE — Progress Notes (Signed)
   Subjective:    Patient ID: Jeffery Valdez, male    DOB: 25-Jan-1951, 65 y.o.   MRN: [id number]  HPI Here for medicare wellness, no new complaints. Please see A/P for status and treatment of chronic medical problems.   Diet: DM since diabetic Physical activity: active Depression/mood screen: negative Hearing: intact to whispered voice Visual acuity: grossly normal, performs annual eye exam  ADLs: capable Fall risk: low Home safety: good Cognitive evaluation: intact to orientation, naming, recall and repetition EOL planning: adv directives discussed, in place  I have personally reviewed and have noted 1. The patient's medical and social history - reviewed today no changes 2. Their use of alcohol, tobacco or illicit drugs 3. Their current medications and supplements 4. The patient's functional ability including ADL's, fall risks, home safety risks and hearing or visual impairment. 5. Diet and physical activities 6. Evidence for depression or mood disorders 7. Care team reviewed and updated (available in snapshot)  Review of Systems  Constitutional: Negative for fever, activity change, appetite change, fatigue and unexpected weight change.  HENT: Negative.   Eyes: Negative.   Respiratory: Negative for cough, chest tightness, shortness of breath and wheezing.   Cardiovascular: Negative for chest pain, palpitations and leg swelling.  Gastrointestinal: Negative for abdominal pain, diarrhea, constipation, blood in stool and abdominal distention.  Musculoskeletal: Positive for back pain and arthralgias. Negative for myalgias and gait problem.  Skin: Negative.   Neurological: Negative for dizziness, weakness, light-headedness and headaches.  Psychiatric/Behavioral: Negative.       Objective:   Physical Exam  Constitutional: He is oriented to person, place, and time. He appears well-developed and well-nourished.  HENT:  Head: Normocephalic and atraumatic.  Eyes: EOM are normal.    Neck: Normal range of motion.  Cardiovascular: Normal rate and regular rhythm.   Pulmonary/Chest: Effort normal and breath sounds normal.  Abdominal: Soft. He exhibits no distension. There is no tenderness.  Musculoskeletal: He exhibits tenderness. He exhibits no edema.  Neurological: He is alert and oriented to person, place, and time. No cranial nerve deficit.  Skin: Skin is warm and dry.   Filed Vitals:   05/07/15 0829  BP: 138/82  Pulse: 70  Temp: 98 F (36.7 C)  TempSrc: Oral  Height: 6' (1.829 m)  Weight: 202 lb (91.627 kg)  SpO2: 97%      Assessment & Plan:  Pneumonia 23 given at visit.

## 2015-05-07 NOTE — Assessment & Plan Note (Signed)
[facility] up to date, [facility]. Given pneumonia 23 to complete pneumonia series. Advised to get the shingles vaccine from the [place]. Tetanus up to date. Counseled on proper home safety and sunscreen for skin cancer prevention. Given 10 year screening recommendations.

## 2015-05-07 NOTE — Assessment & Plan Note (Signed)
BP controlled on lasix and losartan. Getting labs at [facility] in 1-2 weeks and will send [place] the results. Last without indication for change.

## 2015-05-07 NOTE — Assessment & Plan Note (Signed)
Metformin plus ARB. No complications. Recent eye exam, foot exam done today. [facility] and per his reports at goal.

## 2015-05-07 NOTE — Patient Instructions (Signed)
We have given you the pneumonia shot today.   Ask the [facility] about getting the shingles shot.   We will send in the medicines for you and if you have any problems or questions please feel free to call the office.   Health Maintenance, Male A healthy lifestyle and preventative care can promote health and wellness.  Maintain regular health, dental, and eye exams.  Eat a healthy diet. Foods like vegetables, fruits, whole grains, low-fat dairy products, and lean protein foods contain the nutrients you need and are low in calories. Decrease your intake of foods high in solid fats, added sugars, and salt. Get information about a proper diet from your health care provider, if necessary.  Regular physical exercise is one of the most important things you can do for your health. Most adults should get at least 150 minutes of moderate-intensity exercise (any activity that increases your heart rate and causes you to sweat) each week. In addition, most adults need muscle-strengthening exercises on 2 or more days a week.   Maintain a healthy weight. The body mass index (BMI) is a screening tool to identify possible weight problems. It provides an estimate of body fat based on height and weight. Your health care provider can find your BMI and can help you achieve or maintain a healthy weight. For males 20 years and older:  A BMI below 18.5 is considered underweight.  A BMI of 18.5 to 24.9 is normal.  A BMI of 25 to 29.9 is considered overweight.  A BMI of 30 and above is considered obese.  Maintain normal blood lipids and cholesterol by exercising and minimizing your intake of saturated fat. Eat a balanced diet with plenty of fruits and vegetables. Blood tests for lipids and cholesterol should begin at age 85 and be repeated every 5 years. If your lipid or cholesterol levels are high, you are over age 66, or you are at high risk for heart disease, you may need your cholesterol levels checked more  frequently.Ongoing high lipid and cholesterol levels should be treated with medicines if diet and exercise are not working.  If you smoke, find out from your health care provider how to quit. If you do not use tobacco, do not start.  Lung cancer screening is recommended for adults aged 2-80 years who are at high risk for developing lung cancer because of a history of smoking. A yearly low-dose CT scan of the lungs is recommended for people who have at least a 30-pack-year history of smoking and are current smokers or have quit within the past 15 years. A pack year of smoking is smoking an average of 1 pack of cigarettes a day for 1 year (for example, a 30-pack-year history of smoking could mean smoking 1 pack a day for 30 years or 2 packs a day for 15 years). Yearly screening should continue until the smoker has stopped smoking for at least 15 years. Yearly screening should be stopped for people who develop a health problem that would prevent them from having lung cancer treatment.  If you choose to drink alcohol, do not have more than 2 drinks per day. One drink is considered to be 12 oz (360 mL) of beer, 5 oz (150 mL) of wine, or 1.5 oz (45 mL) of liquor.  Avoid the use of street drugs. Do not share needles with anyone. Ask for help if you need support or instructions about stopping the use of drugs.  High blood pressure causes heart disease  and increases the risk of stroke. High blood pressure is more likely to develop in:  People who have blood pressure in the end of the normal range (100-139/85-89 mm Hg).  People who are overweight or obese.  People who are African American.  If you are 58-49 years of age, have your blood pressure checked every 3-5 years. If you are 7 years of age or older, have your blood pressure checked every year. You should have your blood pressure measured twice--once when you are at a hospital or clinic, and once when you are not at a hospital or clinic. Record the  average of the two measurements. To check your blood pressure when you are not at a hospital or clinic, you can use:  An automated blood pressure machine at a pharmacy.  A home blood pressure monitor.  If you are 110-98 years old, ask your health care provider if you should take aspirin to prevent heart disease.  Diabetes screening involves taking a blood sample to check your fasting blood sugar level. This should be done once every 3 years after age 24 if you are at a normal weight and without risk factors for diabetes. Testing should be considered at a younger age or be carried out more frequently if you are overweight and have at least 1 risk factor for diabetes.  Colorectal cancer can be detected and often prevented. Most routine colorectal cancer screening begins at the age of 109 and continues through age 92. However, your health care provider may recommend screening at an earlier age if you have risk factors for colon cancer. On a yearly basis, your health care provider may provide home test kits to check for hidden blood in the stool. A small camera at the end of a tube may be used to directly examine the colon (sigmoidoscopy or colonoscopy) to detect the earliest forms of colorectal cancer. Talk to your health care provider about this at age 41 when routine screening begins. A direct exam of the colon should be repeated every 5-10 years through age 31, unless early forms of precancerous polyps or small growths are found.  People who are at an increased risk for hepatitis B should be screened for this virus. You are considered at high risk for hepatitis B if:  You were born in a country where hepatitis B occurs often. Talk with your health care provider about which countries are considered high risk.  Your parents were born in a high-risk country and you have not received a shot to protect against hepatitis B (hepatitis B vaccine).  You have HIV or AIDS.  You use needles to inject street  drugs.  You live with, or have sex with, someone who has hepatitis B.  You are a man who has sex with other men (MSM).  You get hemodialysis treatment.  You take certain medicines for conditions like cancer, organ transplantation, and autoimmune conditions.  Hepatitis C blood testing is recommended for all people born from [birth year] through 1967 and any individual with known risk factors for hepatitis C.  Healthy men should no longer receive prostate-specific antigen (PSA) blood tests as part of routine cancer screening. Talk to your health care provider about prostate cancer screening.  Testicular cancer screening is not recommended for adolescents or adult males who have no symptoms. Screening includes self-exam, a health care provider exam, and other screening tests. Consult with your health care provider about any symptoms you have or any concerns you have about testicular cancer.  Practice safe sex. Use condoms and avoid high-risk sexual practices to reduce the spread of sexually transmitted infections (STIs).  You should be screened for STIs, including gonorrhea and chlamydia if:  You are sexually active and are younger than 24 years.  You are older than 24 years, and your health care provider tells you that you are at risk for this type of infection.  Your sexual activity has changed since you were last screened, and you are at an increased risk for chlamydia or gonorrhea. Ask your health care provider if you are at risk.  If you are at risk of being infected with HIV, it is recommended that you take a prescription medicine daily to prevent HIV infection. This is called pre-exposure prophylaxis (PrEP). You are considered at risk if:  You are a man who has sex with other men (MSM).  You are a heterosexual man who is sexually active with multiple partners.  You take drugs by injection.  You are sexually active with a partner who has HIV.  Talk with your health care provider about  whether you are at high risk of being infected with HIV. If you choose to begin PrEP, you should first be tested for HIV. You should then be tested every 3 months for as long as you are taking PrEP.  Use sunscreen. Apply sunscreen liberally and repeatedly throughout the day. You should seek shade when your shadow is shorter than you. Protect yourself by wearing long sleeves, pants, a wide-brimmed hat, and sunglasses year round whenever you are outdoors.  Tell your health care provider of new moles or changes in moles, especially if there is a change in shape or color. Also, tell your health care provider if a mole is larger than the size of a pencil eraser.  A one-time screening for abdominal aortic aneurysm (AAA) and surgical repair of large AAAs by ultrasound is recommended for men aged 13-75 years who are current or former smokers.  Stay current with your vaccines (immunizations).   This information is not intended to replace advice given to you by your health care provider. Make sure you discuss any questions you have with your health care provider.   Document Released: 10/14/2007 Document Revised: 05/08/2014 Document Reviewed: 09/12/2010 Elsevier Interactive Patient Education [employer].

## 2015-05-12 DIAGNOSIS — M47816 Spondylosis without myelopathy or radiculopathy, lumbar region: Secondary | ICD-10-CM | POA: Diagnosis not present

## 2015-06-04 DIAGNOSIS — D225 Melanocytic nevi of trunk: Secondary | ICD-10-CM | POA: Diagnosis not present

## 2015-06-04 DIAGNOSIS — Z23 Encounter for immunization: Secondary | ICD-10-CM | POA: Diagnosis not present

## 2015-06-04 DIAGNOSIS — L821 Other seborrheic keratosis: Secondary | ICD-10-CM | POA: Diagnosis not present

## 2015-06-04 DIAGNOSIS — L400 Psoriasis vulgaris: Secondary | ICD-10-CM | POA: Diagnosis not present

## 2015-07-07 DIAGNOSIS — M9903 Segmental and somatic dysfunction of lumbar region: Secondary | ICD-10-CM | POA: Diagnosis not present

## 2015-07-07 DIAGNOSIS — M9904 Segmental and somatic dysfunction of sacral region: Secondary | ICD-10-CM | POA: Diagnosis not present

## 2015-07-07 DIAGNOSIS — M9902 Segmental and somatic dysfunction of thoracic region: Secondary | ICD-10-CM | POA: Diagnosis not present

## 2015-07-07 DIAGNOSIS — M9905 Segmental and somatic dysfunction of pelvic region: Secondary | ICD-10-CM | POA: Diagnosis not present

## 2015-09-01 DIAGNOSIS — L400 Psoriasis vulgaris: Secondary | ICD-10-CM | POA: Diagnosis not present

## 2015-09-22 DIAGNOSIS — M47816 Spondylosis without myelopathy or radiculopathy, lumbar region: Secondary | ICD-10-CM | POA: Diagnosis not present

## 2015-10-01 ENCOUNTER — Encounter: Payer: Self-pay | Admitting: Internal Medicine

## 2015-10-04 ENCOUNTER — Other Ambulatory Visit: Payer: Self-pay | Admitting: *Deleted

## 2015-10-04 MED ORDER — LOSARTAN POTASSIUM 100 MG PO TABS: 100.0000 mg | ORAL_TABLET | Freq: Every day | ORAL | Status: AC

## 2015-10-22 ENCOUNTER — Other Ambulatory Visit: Payer: Self-pay | Admitting: *Deleted

## 2015-10-22 MED ORDER — GABAPENTIN 400 MG PO CAPS: 400.0000 mg | ORAL_CAPSULE | Freq: Two times a day (BID) | ORAL | Status: DC

## 2015-10-25 DIAGNOSIS — M9903 Segmental and somatic dysfunction of lumbar region: Secondary | ICD-10-CM | POA: Diagnosis not present

## 2015-10-25 DIAGNOSIS — M9902 Segmental and somatic dysfunction of thoracic region: Secondary | ICD-10-CM | POA: Diagnosis not present

## 2015-10-25 DIAGNOSIS — M9905 Segmental and somatic dysfunction of pelvic region: Secondary | ICD-10-CM | POA: Diagnosis not present

## 2015-10-25 DIAGNOSIS — M9904 Segmental and somatic dysfunction of sacral region: Secondary | ICD-10-CM | POA: Diagnosis not present

## 2015-11-04 ENCOUNTER — Ambulatory Visit (INDEPENDENT_AMBULATORY_CARE_PROVIDER_SITE_OTHER): Payer: PPO | Admitting: Internal Medicine

## 2015-11-04 ENCOUNTER — Encounter: Payer: Self-pay | Admitting: Internal Medicine

## 2015-11-04 ENCOUNTER — Other Ambulatory Visit (INDEPENDENT_AMBULATORY_CARE_PROVIDER_SITE_OTHER): Payer: PPO

## 2015-11-04 VITALS — BP 132/84 | HR 49 | Temp 98.3°F | Resp 14 | Ht 72.0 in | Wt 199.4 lb

## 2015-11-04 DIAGNOSIS — M545 Low back pain, unspecified: Secondary | ICD-10-CM

## 2015-11-04 DIAGNOSIS — Z418 Encounter for other procedures for purposes other than remedying health state: Secondary | ICD-10-CM | POA: Diagnosis not present

## 2015-11-04 DIAGNOSIS — E119 Type 2 diabetes mellitus without complications: Secondary | ICD-10-CM

## 2015-11-04 DIAGNOSIS — Z299 Encounter for prophylactic measures, unspecified: Secondary | ICD-10-CM

## 2015-11-04 DIAGNOSIS — Z23 Encounter for immunization: Secondary | ICD-10-CM | POA: Diagnosis not present

## 2015-11-04 LAB — COMPREHENSIVE METABOLIC PANEL
ALT: 35 U/L (ref 0–53)
AST: 24 U/L (ref 0–37)
Albumin: 4.6 g/dL (ref 3.5–5.2)
Alkaline Phosphatase: 36 U/L — ABNORMAL LOW (ref 39–117)
BUN: 15 mg/dL (ref 6–23)
CALCIUM: 9.9 mg/dL (ref 8.4–10.5)
CHLORIDE: 103 meq/L (ref 96–112)
CO2: 31 meq/L (ref 19–32)
Creatinine, Ser: 0.99 mg/dL (ref 0.40–1.50)
GFR: 80.12 mL/min (ref >60.00)
Glucose, Bld: 108 mg/dL — ABNORMAL HIGH (ref 70–99)
Potassium: 4.3 mEq/L (ref 3.5–5.1)
Sodium: 140 mEq/L (ref 135–145)
Total Bilirubin: 0.7 mg/dL (ref 0.2–1.2)
Total Protein: 7.3 g/dL (ref 6.0–8.3)

## 2015-11-04 LAB — LIPID PANEL
CHOL/HDL RATIO: 4
Cholesterol: 148 mg/dL (ref 0–200)
HDL: 39.8 mg/dL (ref >39.00)
LDL CALC: 77 mg/dL (ref 0–99)
NonHDL: 108.18
TRIGLYCERIDES: 154 mg/dL — AB (ref 0.0–149.0)
VLDL: 30.8 mg/dL (ref 0.0–40.0)

## 2015-11-04 LAB — HEMOGLOBIN A1C: Hgb A1c MFr Bld: 6 % (ref 4.6–6.5)

## 2015-11-04 MED ORDER — CYCLOBENZAPRINE HCL 5 MG PO TABS: 5.0000 mg | ORAL_TABLET | Freq: Three times a day (TID) | ORAL | Status: AC | PRN

## 2015-11-04 NOTE — Patient Instructions (Addendum)
We have given you the shingles shot today.   We will check the blood work today and call you back with the results.   We have sent in the refill of the muscle relaxer.

## 2015-11-04 NOTE — Assessment & Plan Note (Signed)
Taking metformin and losartan. Not complicated and at goal. Checking [facility] today as [facility] not [facility]. Eye exam and foot exam up to date.

## 2015-11-04 NOTE — Assessment & Plan Note (Signed)
Rx for flexeril again as this did best with pain and minimal side effects. He does use judiciously.

## 2015-11-04 NOTE — Progress Notes (Signed)
   Subjective:    Patient ID: Jeffery Valdez, male    DOB: [birth year], 65 y.o.   MRN: [id number]  HPI The patient is a 65 YO man coming in for follow up of his diabetes (still taking metformin and losartan, not complicated, eye exam up to date). No problems with low sugars. Trying to exercise some but limited by pain. No numbness in his feet or tingling. No eye changes. Sugar was 95 yesterday when he checked it. Also following up on his back pain. The thing that has helped most was the muscle relaxer we gave him. He is out of that and needs refill.   Review of Systems  Constitutional: Negative for fever, activity change, appetite change, fatigue and unexpected weight change.  HENT: Negative.   Respiratory: Negative for cough, chest tightness, shortness of breath and wheezing.   Cardiovascular: Negative for chest pain, palpitations and leg swelling.  Gastrointestinal: Negative for abdominal pain, diarrhea, constipation, blood in stool and abdominal distention.  Musculoskeletal: Positive for back pain and arthralgias. Negative for myalgias and gait problem.  Skin: Negative.   Neurological: Negative for dizziness, weakness, light-headedness and headaches.     Objective:   Physical Exam  Constitutional: He is oriented to person, place, and time. He appears well-developed and well-nourished.  HENT:  Head: Normocephalic and atraumatic.  Eyes: EOM are normal.  Neck: Normal range of motion.  Cardiovascular: Normal rate and regular rhythm.   Pulmonary/Chest: Effort normal and breath sounds normal.  Abdominal: Soft. He exhibits no distension. There is no tenderness.  Musculoskeletal: He exhibits tenderness. He exhibits no edema.  Neurological: He is alert and oriented to person, place, and time. No cranial nerve deficit.  Skin: Skin is warm and dry.   Filed Vitals:   11/04/15 0831  BP: 132/84  Pulse: 49  Temp: 98.3 F (36.8 C)  TempSrc: Oral  Resp: 14  Height: 6' (1.829 m)  Weight:  199 lb 6.4 oz (90.447 kg)  SpO2: 98%      Assessment & Plan:  Shingles shot given at visit.

## 2015-11-04 NOTE — Progress Notes (Signed)
Pre visit review using our clinic review tool, if applicable. No additional management support is needed unless otherwise documented below in the visit note. 

## 2015-11-24 ENCOUNTER — Telehealth: Payer: Self-pay | Admitting: Emergency Medicine

## 2015-11-24 ENCOUNTER — Other Ambulatory Visit: Payer: Self-pay | Admitting: Geriatric Medicine

## 2015-11-24 MED ORDER — FUROSEMIDE 40 MG PO TABS: 40.0000 mg | ORAL_TABLET | Freq: Every day | ORAL | 3 refills | Status: DC

## 2015-11-24 NOTE — Telephone Encounter (Signed)
Pt needs a refill on furosemide (LASIX) 40 MG tablet. Pharmacy is [facility]. Please follow up thanks.

## 2015-11-24 NOTE — Telephone Encounter (Signed)
Sent to [facility]

## 2015-11-26 DIAGNOSIS — M9905 Segmental and somatic dysfunction of pelvic region: Secondary | ICD-10-CM | POA: Diagnosis not present

## 2015-11-26 DIAGNOSIS — M9903 Segmental and somatic dysfunction of lumbar region: Secondary | ICD-10-CM | POA: Diagnosis not present

## 2015-11-26 DIAGNOSIS — M9904 Segmental and somatic dysfunction of sacral region: Secondary | ICD-10-CM | POA: Diagnosis not present

## 2015-11-26 DIAGNOSIS — M9902 Segmental and somatic dysfunction of thoracic region: Secondary | ICD-10-CM | POA: Diagnosis not present

## 2016-01-07 DIAGNOSIS — M9904 Segmental and somatic dysfunction of sacral region: Secondary | ICD-10-CM | POA: Diagnosis not present

## 2016-01-07 DIAGNOSIS — M9901 Segmental and somatic dysfunction of cervical region: Secondary | ICD-10-CM | POA: Diagnosis not present

## 2016-01-07 DIAGNOSIS — M9903 Segmental and somatic dysfunction of lumbar region: Secondary | ICD-10-CM | POA: Diagnosis not present

## 2016-01-07 DIAGNOSIS — M9905 Segmental and somatic dysfunction of pelvic region: Secondary | ICD-10-CM | POA: Diagnosis not present

## 2016-02-01 DIAGNOSIS — H6692 Otitis media, unspecified, left ear: Secondary | ICD-10-CM | POA: Diagnosis not present

## 2016-02-01 DIAGNOSIS — J0100 Acute maxillary sinusitis, unspecified: Secondary | ICD-10-CM | POA: Diagnosis not present

## 2016-02-03 DIAGNOSIS — L400 Psoriasis vulgaris: Secondary | ICD-10-CM | POA: Diagnosis not present

## 2016-02-03 DIAGNOSIS — Z23 Encounter for immunization: Secondary | ICD-10-CM | POA: Diagnosis not present

## 2016-02-11 DIAGNOSIS — M9901 Segmental and somatic dysfunction of cervical region: Secondary | ICD-10-CM | POA: Diagnosis not present

## 2016-02-11 DIAGNOSIS — M9904 Segmental and somatic dysfunction of sacral region: Secondary | ICD-10-CM | POA: Diagnosis not present

## 2016-02-11 DIAGNOSIS — M9905 Segmental and somatic dysfunction of pelvic region: Secondary | ICD-10-CM | POA: Diagnosis not present

## 2016-02-11 DIAGNOSIS — M9903 Segmental and somatic dysfunction of lumbar region: Secondary | ICD-10-CM | POA: Diagnosis not present

## 2016-02-15 DIAGNOSIS — J0100 Acute maxillary sinusitis, unspecified: Secondary | ICD-10-CM | POA: Diagnosis not present

## 2016-02-22 DIAGNOSIS — M9904 Segmental and somatic dysfunction of sacral region: Secondary | ICD-10-CM | POA: Diagnosis not present

## 2016-02-22 DIAGNOSIS — M9903 Segmental and somatic dysfunction of lumbar region: Secondary | ICD-10-CM | POA: Diagnosis not present

## 2016-02-22 DIAGNOSIS — M9905 Segmental and somatic dysfunction of pelvic region: Secondary | ICD-10-CM | POA: Diagnosis not present

## 2016-02-22 DIAGNOSIS — M9901 Segmental and somatic dysfunction of cervical region: Secondary | ICD-10-CM | POA: Diagnosis not present

## 2016-03-01 DIAGNOSIS — M9905 Segmental and somatic dysfunction of pelvic region: Secondary | ICD-10-CM | POA: Diagnosis not present

## 2016-03-01 DIAGNOSIS — M9904 Segmental and somatic dysfunction of sacral region: Secondary | ICD-10-CM | POA: Diagnosis not present

## 2016-03-01 DIAGNOSIS — M9901 Segmental and somatic dysfunction of cervical region: Secondary | ICD-10-CM | POA: Diagnosis not present

## 2016-03-01 DIAGNOSIS — M9903 Segmental and somatic dysfunction of lumbar region: Secondary | ICD-10-CM | POA: Diagnosis not present

## 2016-03-12 NOTE — Progress Notes (Signed)
[facility] [place], [place] [address] Phone: [phone] Subjective:    I'm seeing this patient by the request  of:  Hoyt Koch, MD   CC: Right foot pain follow up  [id number]  Jeffery Valdez is a 65 y.o. male coming in with complaint of right foot pain. Patient was found to have a potential fibroma and plantar fasciitis. Patient was seen last 11 months ago and was given an injection in the fibroma. Since then patient states He was doing well until last month when he went to [place]. Was walking around a significant amount of pain is starting to come back. Feels that the orthotics of broken down. Would like another pair. Feels that the fibroma is coming back but not as severe as what it was previously. Denies any swelling or any new symptoms.  Past Medical History:  Diagnosis Date  . Degeneration of lumbar or lumbosacral intervertebral disc   . Degenerative joint disease of right shoulder   . Diabetes mellitus type 2, controlled ([place])   . Essential hypertension   . Gout, unspecified   . Hyperlipidemia    Past Surgical History:  Procedure Laterality Date  . CIRCUMCISION, NON-NEWBORN    . LUMBAR LAMINECTOMY     '09  . ROTATOR CUFF REPAIR     Right '11   Social History  Substance Use Topics  . Smoking status: Former Smoker    Quit date: 05/01/1972  . Smokeless tobacco: Never Used  . Alcohol use No   Allergies  Allergen Reactions  . Sulfur    Family History  Problem Relation Age of Onset  . Diabetes Mother   . Hypertension Mother   . Peripheral vascular disease Mother   . Other Mother     peripheral neuropathy, Abdominal mass  . Cancer Mother     gastric cancer with metastatic disease  . Hypertension Father   . Pneumonia Father   . Dementia Father   . Other Father     CVA  . Heart disease Father   . Stroke Father   . Coronary artery disease Neg Hx   . Hyperlipidemia Sister   . Hypertension Sister     . Fibromyalgia Brother        Past medical history, social, surgical and family history all reviewed in electronic medical record.   Review of Systems: No headache, visual changes, nausea, vomiting, diarrhea, constipation, dizziness, abdominal pain, skin rash, fevers, chills, night sweats, weight loss, swollen lymph nodes, body aches, joint swelling, muscle aches, chest pain, shortness of breath, mood changes.   Objective  Blood pressure 128/74, pulse 60, height 6' (1.829 m), weight 202 lb (91.6 kg), SpO2 98 %.  Systems examined below as of 03/13/16 General: NAD A&O x3 mood, affect normal  HEENT: Pupils equal, extraocular movements intact no nystagmus Respiratory: not short of breath at rest or with speaking Cardiovascular: No lower extremity edema, non tender Skin: Warm dry intact with no signs of infection or rash on extremities or on axial skeleton. Abdomen: Soft nontender, no masses Neuro: Cranial nerves  intact, neurovascularly intact in all extremities with 2+ DTRs and 2+ pulses. Lymph: No lymphadenopathy appreciated today  Gait normal with good balance and coordination. Mild antalgic gait with actually rotated right foot MSK: Non tender with full range of motion and good stability and symmetric strength and tone of shoulders, elbows, wrist,  knee hips and ankles bilaterally.    Foot exam shows patient  does have pes planus bilaterally. Patient also has some mild breakdown of the transverse arch. Overpronation mild tenderness just distal to the insertion of the plantar fascia on the calcaneal region Nontender on exam today. Neurovascularly intact distally.      Impression and Recommendations:     This case required medical decision making of moderate complexity.

## 2016-03-13 ENCOUNTER — Ambulatory Visit (INDEPENDENT_AMBULATORY_CARE_PROVIDER_SITE_OTHER): Payer: PPO | Admitting: Family Medicine

## 2016-03-13 ENCOUNTER — Encounter: Payer: Self-pay | Admitting: Family Medicine

## 2016-03-13 DIAGNOSIS — D2121 Benign neoplasm of connective and other soft tissue of right lower limb, including hip: Secondary | ICD-10-CM | POA: Diagnosis not present

## 2016-03-13 DIAGNOSIS — M109 Gout, unspecified: Secondary | ICD-10-CM | POA: Diagnosis not present

## 2016-03-13 MED ORDER — VITAMIN D (ERGOCALCIFEROL) 1.25 MG (50000 UNIT) PO CAPS: 50000.0000 [IU] | ORAL_CAPSULE | ORAL | 0 refills | Status: AC

## 2016-03-13 NOTE — Assessment & Plan Note (Addendum)
Encourage good control. On allopurionl

## 2016-03-13 NOTE — Patient Instructions (Signed)
Good to see you  Thanks for the tips on [facility] is your friend  Continue all meds  We will get you the once weekly vitamin D We will get you new orthotics See me again in 2-4 weeks AFTER you get the orthotics.

## 2016-03-13 NOTE — Assessment & Plan Note (Signed)
Worsening symptoms. Discussed with patient again at great length. We discussed possibly repeating injection which patient declined. Feels that it better orthotics he will do well. We'll reorder him new orthotics. We discussed icing regimen, we discussed continuing the home exercises, continue good shoes. Follow-up again in 4 weeks after the new orthotics.  Spent  25 minutes with patient face-to-face and had greater than 50% of counseling including as described above in assessment and plan.

## 2016-03-15 DIAGNOSIS — J0100 Acute maxillary sinusitis, unspecified: Secondary | ICD-10-CM | POA: Diagnosis not present

## 2016-03-16 NOTE — Assessment & Plan Note (Addendum)
Patient was fitted for custom orthotics today. Patient will slowly increase wear over the course of time. Patient will follow-up again in 2-4 weeks to make sure pain is improving.

## 2016-03-16 NOTE — Progress Notes (Signed)
Procedure Note   Patient was fitted for a : standard, cushioned, semi-rigid orthotic. The orthotic was heated and afterward the patient patient seated position and molded The patient was positioned in subtalar neutral position and 10 degrees of ankle dorsiflexion in a weight bearing stance. After completion of molding, patient did have orthotic management The blank was ground to a stable position for weight bearing. Size: 11.5, Comfort  Base: Carbon fiber Additional Posting and Padding: bilateral medial 300 postings as well as bilateral metatarsal 250 postings  The patient ambulated these, and they were very comfortable.

## 2016-03-17 ENCOUNTER — Ambulatory Visit (INDEPENDENT_AMBULATORY_CARE_PROVIDER_SITE_OTHER): Payer: PPO | Admitting: Family Medicine

## 2016-03-17 DIAGNOSIS — D2121 Benign neoplasm of connective and other soft tissue of right lower limb, including hip: Secondary | ICD-10-CM | POA: Diagnosis not present

## 2016-03-22 DIAGNOSIS — M47816 Spondylosis without myelopathy or radiculopathy, lumbar region: Secondary | ICD-10-CM | POA: Diagnosis not present

## 2016-03-31 ENCOUNTER — Other Ambulatory Visit: Payer: Self-pay | Admitting: Internal Medicine

## 2016-04-07 DIAGNOSIS — M9905 Segmental and somatic dysfunction of pelvic region: Secondary | ICD-10-CM | POA: Diagnosis not present

## 2016-04-07 DIAGNOSIS — M9901 Segmental and somatic dysfunction of cervical region: Secondary | ICD-10-CM | POA: Diagnosis not present

## 2016-04-07 DIAGNOSIS — M9904 Segmental and somatic dysfunction of sacral region: Secondary | ICD-10-CM | POA: Diagnosis not present

## 2016-04-07 DIAGNOSIS — M9903 Segmental and somatic dysfunction of lumbar region: Secondary | ICD-10-CM | POA: Diagnosis not present

## 2016-04-21 DIAGNOSIS — M9903 Segmental and somatic dysfunction of lumbar region: Secondary | ICD-10-CM | POA: Diagnosis not present

## 2016-04-21 DIAGNOSIS — M9904 Segmental and somatic dysfunction of sacral region: Secondary | ICD-10-CM | POA: Diagnosis not present

## 2016-04-21 DIAGNOSIS — M9905 Segmental and somatic dysfunction of pelvic region: Secondary | ICD-10-CM | POA: Diagnosis not present

## 2016-04-21 DIAGNOSIS — M9901 Segmental and somatic dysfunction of cervical region: Secondary | ICD-10-CM | POA: Diagnosis not present

## 2016-04-26 NOTE — Telephone Encounter (Signed)
error 

## 2016-06-03 ENCOUNTER — Other Ambulatory Visit: Payer: Self-pay | Admitting: Internal Medicine

## 2016-06-19 DIAGNOSIS — M9903 Segmental and somatic dysfunction of lumbar region: Secondary | ICD-10-CM | POA: Diagnosis not present

## 2016-06-19 DIAGNOSIS — M9904 Segmental and somatic dysfunction of sacral region: Secondary | ICD-10-CM | POA: Diagnosis not present

## 2016-06-19 DIAGNOSIS — M9905 Segmental and somatic dysfunction of pelvic region: Secondary | ICD-10-CM | POA: Diagnosis not present

## 2016-06-19 DIAGNOSIS — M9901 Segmental and somatic dysfunction of cervical region: Secondary | ICD-10-CM | POA: Diagnosis not present

## 2016-08-14 DIAGNOSIS — M9904 Segmental and somatic dysfunction of sacral region: Secondary | ICD-10-CM | POA: Diagnosis not present

## 2016-08-14 DIAGNOSIS — M9903 Segmental and somatic dysfunction of lumbar region: Secondary | ICD-10-CM | POA: Diagnosis not present

## 2016-08-14 DIAGNOSIS — M9902 Segmental and somatic dysfunction of thoracic region: Secondary | ICD-10-CM | POA: Diagnosis not present

## 2016-08-14 DIAGNOSIS — M9905 Segmental and somatic dysfunction of pelvic region: Secondary | ICD-10-CM | POA: Diagnosis not present

## 2016-08-15 DIAGNOSIS — M9904 Segmental and somatic dysfunction of sacral region: Secondary | ICD-10-CM | POA: Diagnosis not present

## 2016-08-15 DIAGNOSIS — M9903 Segmental and somatic dysfunction of lumbar region: Secondary | ICD-10-CM | POA: Diagnosis not present

## 2016-08-15 DIAGNOSIS — M9905 Segmental and somatic dysfunction of pelvic region: Secondary | ICD-10-CM | POA: Diagnosis not present

## 2016-08-15 DIAGNOSIS — M9902 Segmental and somatic dysfunction of thoracic region: Secondary | ICD-10-CM | POA: Diagnosis not present

## 2016-08-24 ENCOUNTER — Telehealth: Payer: Self-pay | Admitting: Internal Medicine

## 2016-08-24 NOTE — Telephone Encounter (Signed)
Spoke to Mr. Blatt. He stated that he is now living in [facility]. Updated address in system and also removed Dr. Sharlet Salina as PCP.
